# Patient Record
Sex: Male | Born: 1955 | Race: White | Hispanic: No | Smoking: Never smoker
Health system: Southern US, Community
[De-identification: ages and names within clinical notes are randomized; demographics above are authoritative.]

## PROBLEM LIST (undated history)

## (undated) DIAGNOSIS — E669 Obesity, unspecified: Secondary | ICD-10-CM

## (undated) DIAGNOSIS — I5022 Chronic systolic (congestive) heart failure: Secondary | ICD-10-CM

## (undated) DIAGNOSIS — E119 Type 2 diabetes mellitus without complications: Secondary | ICD-10-CM

## (undated) DIAGNOSIS — N183 Chronic kidney disease, stage 3 unspecified: Secondary | ICD-10-CM

## (undated) HISTORY — PX: APPENDECTOMY: SHX54

## (undated) HISTORY — PX: SPLENECTOMY: SUR1306

---

## 2016-01-07 ENCOUNTER — Emergency Department: Payer: Self-pay

## 2016-01-07 ENCOUNTER — Inpatient Hospital Stay
Admit: 2016-01-07 | Discharge: 2016-01-07 | Disposition: A | Discharge status: Home / Self Care | Payer: Self-pay | Attending: Internal Medicine | Admitting: Internal Medicine

## 2016-01-07 ENCOUNTER — Inpatient Hospital Stay
Admission: EM | Admit: 2016-01-07 | Discharge: 2016-01-11 | DRG: 291 | Disposition: A | Discharge status: Home / Self Care | Payer: Self-pay | Attending: Internal Medicine | Admitting: Internal Medicine

## 2016-01-07 ENCOUNTER — Encounter: Payer: Self-pay | Admitting: *Deleted

## 2016-01-07 DIAGNOSIS — E1122 Type 2 diabetes mellitus with diabetic chronic kidney disease: Secondary | ICD-10-CM | POA: Diagnosis present

## 2016-01-07 DIAGNOSIS — E669 Obesity, unspecified: Secondary | ICD-10-CM | POA: Diagnosis present

## 2016-01-07 DIAGNOSIS — E1165 Type 2 diabetes mellitus with hyperglycemia: Secondary | ICD-10-CM | POA: Diagnosis present

## 2016-01-07 DIAGNOSIS — R739 Hyperglycemia, unspecified: Secondary | ICD-10-CM | POA: Diagnosis present

## 2016-01-07 DIAGNOSIS — Z9081 Acquired absence of spleen: Secondary | ICD-10-CM

## 2016-01-07 DIAGNOSIS — I509 Heart failure, unspecified: Secondary | ICD-10-CM | POA: Insufficient documentation

## 2016-01-07 DIAGNOSIS — I248 Other forms of acute ischemic heart disease: Secondary | ICD-10-CM | POA: Diagnosis present

## 2016-01-07 DIAGNOSIS — I5041 Acute combined systolic (congestive) and diastolic (congestive) heart failure: Secondary | ICD-10-CM

## 2016-01-07 DIAGNOSIS — R748 Abnormal levels of other serum enzymes: Secondary | ICD-10-CM | POA: Diagnosis present

## 2016-01-07 DIAGNOSIS — N183 Chronic kidney disease, stage 3 unspecified: Secondary | ICD-10-CM

## 2016-01-07 DIAGNOSIS — R7989 Other specified abnormal findings of blood chemistry: Secondary | ICD-10-CM

## 2016-01-07 DIAGNOSIS — R778 Other specified abnormalities of plasma proteins: Secondary | ICD-10-CM

## 2016-01-07 DIAGNOSIS — Z599 Problem related to housing and economic circumstances, unspecified: Secondary | ICD-10-CM

## 2016-01-07 DIAGNOSIS — R9431 Abnormal electrocardiogram [ECG] [EKG]: Secondary | ICD-10-CM | POA: Diagnosis present

## 2016-01-07 DIAGNOSIS — R0600 Dyspnea, unspecified: Secondary | ICD-10-CM

## 2016-01-07 DIAGNOSIS — R600 Localized edema: Secondary | ICD-10-CM

## 2016-01-07 DIAGNOSIS — E119 Type 2 diabetes mellitus without complications: Secondary | ICD-10-CM

## 2016-01-07 DIAGNOSIS — I5043 Acute on chronic combined systolic (congestive) and diastolic (congestive) heart failure: Secondary | ICD-10-CM | POA: Diagnosis present

## 2016-01-07 DIAGNOSIS — R0902 Hypoxemia: Secondary | ICD-10-CM | POA: Diagnosis present

## 2016-01-07 DIAGNOSIS — I252 Old myocardial infarction: Secondary | ICD-10-CM

## 2016-01-07 DIAGNOSIS — Z9119 Patient's noncompliance with other medical treatment and regimen: Secondary | ICD-10-CM

## 2016-01-07 DIAGNOSIS — Z6829 Body mass index (BMI) 29.0-29.9, adult: Secondary | ICD-10-CM

## 2016-01-07 DIAGNOSIS — I130 Hypertensive heart and chronic kidney disease with heart failure and stage 1 through stage 4 chronic kidney disease, or unspecified chronic kidney disease: Principal | ICD-10-CM | POA: Diagnosis present

## 2016-01-07 DIAGNOSIS — I420 Dilated cardiomyopathy: Secondary | ICD-10-CM

## 2016-01-07 DIAGNOSIS — N289 Disorder of kidney and ureter, unspecified: Secondary | ICD-10-CM | POA: Diagnosis present

## 2016-01-07 DIAGNOSIS — R000 Tachycardia, unspecified: Secondary | ICD-10-CM | POA: Diagnosis present

## 2016-01-07 DIAGNOSIS — N179 Acute kidney failure, unspecified: Secondary | ICD-10-CM | POA: Diagnosis not present

## 2016-01-07 DIAGNOSIS — R630 Anorexia: Secondary | ICD-10-CM | POA: Diagnosis present

## 2016-01-07 DIAGNOSIS — R188 Other ascites: Secondary | ICD-10-CM | POA: Diagnosis present

## 2016-01-07 DIAGNOSIS — Z23 Encounter for immunization: Secondary | ICD-10-CM

## 2016-01-07 HISTORY — DX: Chronic kidney disease, stage 3 unspecified: N18.30

## 2016-01-07 HISTORY — DX: Chronic kidney disease, stage 3 (moderate): N18.3

## 2016-01-07 HISTORY — DX: Chronic systolic (congestive) heart failure: I50.22

## 2016-01-07 HISTORY — DX: Type 2 diabetes mellitus without complications: E11.9

## 2016-01-07 HISTORY — DX: Obesity, unspecified: E66.9

## 2016-01-07 LAB — LIPID PANEL
CHOLESTEROL: 144 mg/dL (ref 0–200)
HDL: 30 mg/dL — ABNORMAL LOW (ref >40)
LDL Cholesterol: 96 mg/dL (ref 0–99)
Total CHOL/HDL Ratio: 4.8 RATIO
Triglycerides: 88 mg/dL (ref <150)
VLDL: 18 mg/dL (ref 0–40)

## 2016-01-07 LAB — HEPATIC FUNCTION PANEL
ALT: 26 U/L (ref 17–63)
AST: 46 U/L — ABNORMAL HIGH (ref 15–41)
Albumin: 3.8 g/dL (ref 3.5–5.0)
Alkaline Phosphatase: 85 U/L (ref 38–126)
Bilirubin, Direct: 0.2 mg/dL (ref 0.1–0.5)
Indirect Bilirubin: 0.9 mg/dL (ref 0.3–0.9)
Total Bilirubin: 1.1 mg/dL (ref 0.3–1.2)
Total Protein: 6.7 g/dL (ref 6.5–8.1)

## 2016-01-07 LAB — BASIC METABOLIC PANEL
Anion gap: 6 (ref 5–15)
BUN: 30 mg/dL — AB (ref 6–20)
CO2: 28 mmol/L (ref 22–32)
Calcium: 9.1 mg/dL (ref 8.9–10.3)
Chloride: 105 mmol/L (ref 101–111)
Creatinine, Ser: 2.15 mg/dL — ABNORMAL HIGH (ref 0.61–1.24)
GFR calc Af Amer: 37 mL/min — ABNORMAL LOW (ref >60)
GFR, EST NON AFRICAN AMERICAN: 32 mL/min — AB (ref >60)
GLUCOSE: 163 mg/dL — AB (ref 65–99)
POTASSIUM: 4.8 mmol/L (ref 3.5–5.1)
Sodium: 139 mmol/L (ref 135–145)

## 2016-01-07 LAB — CBC
HCT: 46.9 % (ref 40.0–52.0)
Hemoglobin: 15.3 g/dL (ref 13.0–18.0)
MCH: 32.3 pg (ref 26.0–34.0)
MCHC: 32.7 g/dL (ref 32.0–36.0)
MCV: 98.7 fL (ref 80.0–100.0)
Platelets: 229 10*3/uL (ref 150–440)
RBC: 4.75 MIL/uL (ref 4.40–5.90)
RDW: 14.6 % — ABNORMAL HIGH (ref 11.5–14.5)
WBC: 9.8 10*3/uL (ref 3.8–10.6)

## 2016-01-07 LAB — BRAIN NATRIURETIC PEPTIDE: B NATRIURETIC PEPTIDE 5: 1655 pg/mL — AB (ref 0.0–100.0)

## 2016-01-07 LAB — TROPONIN I
Troponin I: 0.11 ng/mL — ABNORMAL HIGH (ref <0.031)
Troponin I: 0.12 ng/mL — ABNORMAL HIGH (ref <0.031)
Troponin I: 0.12 ng/mL — ABNORMAL HIGH (ref <0.031)

## 2016-01-07 MED ORDER — HEPARIN SODIUM (PORCINE) 5000 UNIT/ML IJ SOLN
5000.0000 [IU] | Freq: Three times a day (TID) | INTRAMUSCULAR | Status: DC
  Administered 2016-01-07 – 2016-01-08 (×2): 5000 [IU] via SUBCUTANEOUS
  Filled 2016-01-07 (×2): qty 1

## 2016-01-07 MED ORDER — FUROSEMIDE 10 MG/ML IJ SOLN
60.0000 mg | Freq: Once | INTRAMUSCULAR | Status: AC
Start: 2016-01-07 — End: 2016-01-07
  Administered 2016-01-07: 60 mg via INTRAVENOUS
  Filled 2016-01-07: qty 8

## 2016-01-07 MED ORDER — METOPROLOL TARTRATE 25 MG PO TABS
25.0000 mg | ORAL_TABLET | Freq: Two times a day (BID) | ORAL | Status: DC
  Administered 2016-01-07 – 2016-01-08 (×2): 25 mg via ORAL
  Filled 2016-01-07 (×2): qty 1

## 2016-01-07 MED ORDER — SODIUM CHLORIDE 0.9% FLUSH
3.0000 mL | Freq: Two times a day (BID) | INTRAVENOUS | Status: DC
  Administered 2016-01-07 – 2016-01-10 (×6): 3 mL via INTRAVENOUS

## 2016-01-07 MED ORDER — PNEUMOCOCCAL VAC POLYVALENT 25 MCG/0.5ML IJ INJ
0.5000 mL | INJECTION | INTRAMUSCULAR | Status: AC
  Administered 2016-01-08: 0.5 mL via INTRAMUSCULAR
  Filled 2016-01-07 (×2): qty 0.5

## 2016-01-07 MED ORDER — FUROSEMIDE 10 MG/ML IJ SOLN
20.0000 mg | Freq: Two times a day (BID) | INTRAMUSCULAR | Status: DC
  Administered 2016-01-07 – 2016-01-08 (×2): 20 mg via INTRAVENOUS
  Filled 2016-01-07: qty 2
  Filled 2016-01-07: qty 4

## 2016-01-07 MED ORDER — INFLUENZA VAC SPLIT QUAD 0.5 ML IM SUSY
0.5000 mL | PREFILLED_SYRINGE | INTRAMUSCULAR | Status: AC
  Administered 2016-01-08: 0.5 mL via INTRAMUSCULAR
  Filled 2016-01-07: qty 0.5

## 2016-01-07 MED ORDER — ASPIRIN 81 MG PO CHEW
324.0000 mg | CHEWABLE_TABLET | Freq: Once | ORAL | Status: AC
  Administered 2016-01-07: 324 mg via ORAL
  Filled 2016-01-07: qty 4

## 2016-01-07 NOTE — Progress Notes (Signed)
*  PRELIMINARY RESULTS* Echocardiogram 2D Echocardiogram has been performed.  Cristian Goodman 01/07/2016, 9:40 PM

## 2016-01-07 NOTE — ED Notes (Addendum)
Pt >90% on RA

## 2016-01-07 NOTE — ED Notes (Signed)
Pt placed on 2L [place]

## 2016-01-07 NOTE — ED Notes (Signed)
Report given to floor RN. Pt taken to floor via stretcher. Vital signs stable prior to transport.  

## 2016-01-07 NOTE — H&P (Signed)
[facility] - [place] at [facility]   PATIENT NAME: Cristian Goodman    MR#:  [id number]  DATE OF BIRTH:  03-Mar-1956  DATE OF ADMISSION:  01/07/2016  PRIMARY CARE PHYSICIAN: No PCP Per Patient   REQUESTING/REFERRING PHYSICIAN: McShane  CHIEF COMPLAINT:   Chief Complaint  Patient presents with  . Shortness of Breath  . Ascites    HISTORY OF PRESENT ILLNESS: Cristian Goodman  is a 60 y.o. male with a known history of unknown, as he does not go to a doctor. For last 1 or 2 months he started noticing increasing swelling on his legs and all over the body, but denies any shortness of breath or chest pain Consulting with his swelling he also started taking some fluid pills before from his related issues and family members who had heart failure. But it did not help much and so he came to emergency room today. He is noted to have pulmonary edema on chest x-ray and BNP level is high.  PAST MEDICAL HISTORY:  He does not go to her doctor, last time he went for physical checkup was 40 years ago and so he does not know about any past medical history.  PAST SURGICAL HISTORY: Past Surgical History  Procedure Laterality Date  . Appendectomy    . Spleenectomy      SOCIAL HISTORY:  Social History  Substance Use Topics  . Smoking status: Never Smoker   . Smokeless tobacco: Not on file  . Alcohol Use: Not on file    FAMILY HISTORY:  Family History  Problem Relation Age of Onset  . Alzheimer's disease Mother   . Diabetes Father   . CAD Father     DRUG ALLERGIES: No Known Allergies  REVIEW OF SYSTEMS:   CONSTITUTIONAL: No fever, fatigue or weakness.  EYES: No blurred or double vision.  EARS, NOSE, AND THROAT: No tinnitus or ear pain.  RESPIRATORY: No cough, shortness of breath, wheezing or hemoptysis.  CARDIOVASCULAR: No chest pain, orthopnea, edema.  GASTROINTESTINAL: No nausea, vomiting, diarrhea or abdominal pain.  GENITOURINARY: No dysuria, hematuria.   ENDOCRINE: No polyuria, nocturia,  HEMATOLOGY: No anemia, easy bruising or bleeding SKIN: No rash or lesion. MUSCULOSKELETAL: No joint pain or arthritis.  Severe edema on both legs.  NEUROLOGIC: No tingling, numbness, weakness.  PSYCHIATRY: No anxiety or depression.   MEDICATIONS AT HOME:  Prior to Admission medications   Not on File      PHYSICAL EXAMINATION:   VITAL SIGNS: Blood pressure 118/89, pulse 116, temperature 97.5 F (36.4 C), temperature source Oral, resp. rate 19, height 5\' 6"  (1.676 m), weight 99.791 kg (220 lb), SpO2 100 %.  GENERAL:  60 y.o.-year-old patient lying in the bed with no acute distress.  EYES: Pupils equal, round, reactive to light and accommodation. No scleral icterus. Extraocular muscles intact.  HEENT: Head atraumatic, normocephalic. Oropharynx and nasopharynx clear.  NECK:  Supple, no jugular venous distention. No thyroid enlargement, no tenderness.  LUNGS: Normal breath sounds bilaterally, no wheezing, some crepitation. No use of accessory muscles of respiration. Requiring nasal cannula oxygen. CARDIOVASCULAR: S1, S2 normal. No murmurs, rubs, or gallops.  ABDOMEN: Soft, nontender, nondistended. Bowel sounds present. No organomegaly or mass.  EXTREMITIES: Severe pedal edema, no cyanosis, or clubbing.  NEUROLOGIC: Cranial nerves II through XII are intact. Muscle strength 5/5 in all extremities. Sensation intact. Gait not checked.  PSYCHIATRIC: The patient is alert and oriented x 3.  SKIN: No obvious rash, lesion, or ulcer.  LABORATORY PANEL:   CBC  Recent Labs Lab 01/07/16 1355  WBC 9.8  HGB 15.3  HCT 46.9  PLT 229  MCV 98.7  MCH 32.3  MCHC 32.7  RDW 14.6*   ------------------------------------------------------------------------------------------------------------------  Chemistries   Recent Labs Lab 01/07/16 1355  NA 139  K 4.8  CL 105  CO2 28  GLUCOSE 163*  BUN 30*  CREATININE 2.15*  CALCIUM 9.1  AST 46*  ALT 26   ALKPHOS 85  BILITOT 1.1   ------------------------------------------------------------------------------------------------------------------ estimated creatinine clearance is 40.4 mL/min (by C-G formula based on Cr of 2.15). ------------------------------------------------------------------------------------------------------------------ No results for input(s): TSH, T4TOTAL, T3FREE, THYROIDAB in the last 72 hours.  Invalid input(s): FREET3   Coagulation profile No results for input(s): INR, PROTIME in the last 168 hours. ------------------------------------------------------------------------------------------------------------------- No results for input(s): DDIMER in the last 72 hours. -------------------------------------------------------------------------------------------------------------------  Cardiac Enzymes  Recent Labs Lab 01/07/16 1355  TROPONINI 0.11*   ------------------------------------------------------------------------------------------------------------------ Invalid input(s): POCBNP  ---------------------------------------------------------------------------------------------------------------  Urinalysis No results found for: COLORURINE, APPEARANCEUR, LABSPEC, PHURINE, GLUCOSEU, HGBUR, BILIRUBINUR, KETONESUR, PROTEINUR, UROBILINOGEN, NITRITE, LEUKOCYTESUR   RADIOLOGY: Dg Chest 2 View  01/07/2016  CLINICAL DATA:  Two months of shortness of breath especially with exertion, abdominal distention, and lower extremity edema. EXAM: CHEST  2 VIEW COMPARISON:  None in PACs FINDINGS: The lungs are well-expanded. The interstitial markings are increased bilaterally. There small bilateral pleural effusions blunting the costophrenic angles. The cardiac silhouette is enlarged. The pulmonary vascularity is indistinct. The trachea is midline. The bony thorax exhibits no acute abnormality. IMPRESSION: CHF with mild pulmonary interstitial edema and small bilateral pleural  effusions. There is no evidence of pneumonia. Electronically Signed   By: David  Swaz[place] M.D.   On: 01/07/2016 14:26    EKG:  Sinus tachycardia  IMPRESSION AND PLAN:  * Acute CHF  Unknown systolic versus diastolic, get echocardiogram.  Monitor on telemetry, monitor serial troponin.  IV Lasix, intake and output measurement, daily weight, fluid restriction.  * Sinus tachycardia  Most likely secondary to acute CHF and hypoxia, blood pressure is acceptable, we'll start on oral metoprolol.  * Non-compliance to medical follow-ups- case manager to assist with financial issues.  *   Hyperglycemia  Check hemoglobin A1c.  * Renal insufficiency   I'm not sure if this creatinine 2.15 is acute or chronic for him, as he has no follow-ups and lab works in last many years.   With IV Lasix continue monitoring renal function.   All the records are reviewed and case discussed with ED provider. Management plans discussed with the patient, family and they are in agreement.  CODE STATUS: Full code Code Status History    This patient does not have a recorded code status. Please follow your organizational policy for patients in this situation.       TOTAL TIME TAKING CARE OF THIS PATIENT: 50 minutes.  Plan discussed with patient's sister who was present in the room.  Altamese Dilling M.D on 01/07/2016   Between 7am to 6pm - Pager - [phone]  After 6pm go to [email or web address] - password [email or web address] [facility]  [facility]  Office  [phone]  CC: Primary care physician; No PCP Per Patient   Note: This dictation was prepared with Dragon dictation along with smaller phrase technology. Any transcriptional errors that result from this process are unintentional.

## 2016-01-07 NOTE — ED Notes (Addendum)
States shortness of breathe for the past 2 months, states espeically when he is up and walking, abd distention noticed upon assessment, pt states he has been distended for 2 months, pt face pale in color, pt awake and alert, denies any cardiac or medical problems, edema noted to lower ext

## 2016-01-07 NOTE — ED Notes (Signed)
Assisted pt with using the bathroom and hooked him back up to the monitor.

## 2016-01-07 NOTE — ED Provider Notes (Signed)
[facility] Emergency Department Provider Note  ____________________________________________   I have reviewed the triage vital signs and the nursing notes.   HISTORY  Chief Complaint Shortness of Breath and Ascites    HPI Cristian Goodman is a 60 y.o. male denies having any significant past medical history, did have a planned appendectomy and splenectomy years ago he states. Patient has not seen a doctor he states in 40 years. Over the last 2-3 months he has had gradually increasing shortness of breath when he walks around and lower extremity edema, he feels his abdomen is now becoming distended with fluid. He denies any fever or chills. He does not have a cough. He does not have any chest pain he has no chest pain at this time. He has never had an echocardiogram. Walking makes him more exertionally dyspneic, resting seems to help. He is not markedly Presently. Patient denies any abdominal pain nausea or vomiting. Resting makes the symptoms worse.  History reviewed. No pertinent past medical history.  There are no active problems to display for this patient.   Past Surgical History  Procedure Laterality Date  . Appendectomy    . Spleenectomy      No current outpatient prescriptions on file.  Allergies Review of patient's allergies indicates no known allergies.  History reviewed. No pertinent family history.  Social History Social History  Substance Use Topics  . Smoking status: Never Smoker   . Smokeless tobacco: None  . Alcohol Use: None    Review of Systems Constitutional: No fever/chills Eyes: No visual changes. ENT: No sore throat. No stiff neck no neck pain Cardiovascular: Denies chest pain. Respiratory: Positive shortness of breath. Gastrointestinal:   no vomiting.  No diarrhea.  No constipation. Genitourinary: Negative for dysuria. Musculoskeletal: Positive lower extremity swelling Skin: Negative for rash. Neurological: Negative for  headaches, focal weakness or numbness. 10-point ROS otherwise negative.  ____________________________________________   PHYSICAL EXAM:  VITAL SIGNS: ED Triage Vitals  Enc Vitals Group     BP 01/07/16 1353 136/109 mmHg     Pulse Rate 01/07/16 1353 120     Resp 01/07/16 1353 18     Temp 01/07/16 1353 97.5 F (36.4 C)     Temp Source 01/07/16 1353 Oral     SpO2 01/07/16 1353 100 %     Weight 01/07/16 1353 220 lb (99.791 kg)     Height 01/07/16 1353  (1.676 m)     Head Cir --      Peak Flow --      Pain Score --      Pain Loc --      Pain Edu? --      Excl. in GC? --     Constitutional: Alert and oriented. Well appearing and in no acute distress. Eyes: Conjunctivae are normal. PERRL. EOMI. Head: Atraumatic. Nose: No congestion/rhinnorhea. Mouth/Throat: Mucous membranes are moist.  Oropharynx non-erythematous. Neck: No stridor.   Nontender with no meningismus Cardiovascular: Mild tachycardia noted after the patient sits up in bed regular rhythm. Grossly normal heart sounds.  Good peripheral circulation. Respiratory: Patient has tachypnea, he is very diminished in the baselines with occasional rails. No rhonchi no wheeze. Abdominal: Soft and nontender. No distention. No guarding no rebound Back:  There is no focal tenderness or step off there is no midline tenderness there are no lesions noted. there is no CVA tenderness Musculoskeletal: No lower extremity tenderness. No joint effusions, no DVT signs strong distal pulses 3+ bilateral  symmetric pitting edema Neurologic:  Normal speech and language. No gross focal neurologic deficits are appreciated.  Skin:  Skin is warm, dry and intact. No rash noted. Psychiatric: Mood and affect are somewhat anxious. Speech and behavior are normal.  ____________________________________________   LABS (all labs ordered are listed, but only abnormal results are displayed)  Labs Reviewed  BASIC METABOLIC PANEL - Abnormal; Notable for the  following:    Glucose, Bld 163 (*)    BUN 30 (*)    Creatinine, Ser 2.15 (*)    GFR calc non Af Amer 32 (*)    GFR calc Af Amer 37 (*)    All other components within normal limits  CBC - Abnormal; Notable for the following:    RDW 14.6 (*)    All other components within normal limits  TROPONIN I - Abnormal; Notable for the following:    Troponin I 0.11 (*)    All other components within normal limits  BRAIN NATRIURETIC PEPTIDE - Abnormal; Notable for the following:    B Natriuretic Peptide 1655.0 (*)    All other components within normal limits   ____________________________________________  EKG  I personally interpreted any EKGs ordered by me or triage Sinus tachycardia rate 119 no acute ST elevation or acute ST depression normal axis specific ST changes. ____________________________________________  RADIOLOGY  I reviewed any imaging ordered by me or triage that were performed during my shift ____________________________________________   PROCEDURES  Procedure(s) performed: None  Critical Care performed: None  ____________________________________________   INITIAL IMPRESSION / ASSESSMENT AND PLAN / ED COURSE  Pertinent labs & imaging results that were available during my care of the patient were reviewed by me and considered in my medical decision making (see chart for details).  Patient with CHF which has been getting gradually worse for the last 2 months with clear bilateral pitting edema, shortness of breath even moving around the bed. BNP is 1655, troponin 0.11. Does not have any evidence of PE, I do not think that is a likely etiology for this obvious CHF picture. Chest x-ray is consistent with fluid. We will give him IV Lasix, aspirin although he is not having chest pain and his troponin is borderline elevated, patient will require inpatient hospital stay for diuresis the very least. Patient is amenable to this plan. If he has chest pain he will let us know.  ____________________________________________   FINAL CLINICAL IMPRESSION(S) / ED DIAGNOSES  Final diagnoses:  None      This chart was dictated using voice recognition software.  Despite best efforts to proofread,  errors can occur which can change meaning.     Jeanmarie PlantJames A Abubakr Wieman, MD 01/07/16 1447

## 2016-01-07 NOTE — ED Notes (Signed)
Dr. McShane at bedside.  

## 2016-01-08 ENCOUNTER — Encounter: Payer: Self-pay | Admitting: Nurse Practitioner

## 2016-01-08 ENCOUNTER — Inpatient Hospital Stay: Payer: Self-pay

## 2016-01-08 DIAGNOSIS — R9431 Abnormal electrocardiogram [ECG] [EKG]: Secondary | ICD-10-CM

## 2016-01-08 DIAGNOSIS — R778 Other specified abnormalities of plasma proteins: Secondary | ICD-10-CM

## 2016-01-08 DIAGNOSIS — R600 Localized edema: Secondary | ICD-10-CM

## 2016-01-08 DIAGNOSIS — R0600 Dyspnea, unspecified: Secondary | ICD-10-CM

## 2016-01-08 DIAGNOSIS — I5041 Acute combined systolic (congestive) and diastolic (congestive) heart failure: Secondary | ICD-10-CM

## 2016-01-08 DIAGNOSIS — R7989 Other specified abnormal findings of blood chemistry: Secondary | ICD-10-CM

## 2016-01-08 LAB — BASIC METABOLIC PANEL
ANION GAP: 7 (ref 5–15)
BUN: 28 mg/dL — ABNORMAL HIGH (ref 6–20)
CALCIUM: 9.1 mg/dL (ref 8.9–10.3)
CO2: 31 mmol/L (ref 22–32)
Chloride: 104 mmol/L (ref 101–111)
Creatinine, Ser: 2.02 mg/dL — ABNORMAL HIGH (ref 0.61–1.24)
GFR, EST AFRICAN AMERICAN: 40 mL/min — AB (ref >60)
GFR, EST NON AFRICAN AMERICAN: 34 mL/min — AB (ref >60)
GLUCOSE: 89 mg/dL (ref 65–99)
POTASSIUM: 4.5 mmol/L (ref 3.5–5.1)
SODIUM: 142 mmol/L (ref 135–145)

## 2016-01-08 LAB — HEMOGLOBIN A1C: HEMOGLOBIN A1C: 7.2 % — AB (ref 4.0–6.0)

## 2016-01-08 LAB — CBC
HCT: 43.2 % (ref 40.0–52.0)
HEMOGLOBIN: 14.3 g/dL (ref 13.0–18.0)
MCH: 32.9 pg (ref 26.0–34.0)
MCHC: 33 g/dL (ref 32.0–36.0)
MCV: 99.8 fL (ref 80.0–100.0)
Platelets: 206 10*3/uL (ref 150–440)
RBC: 4.33 MIL/uL — ABNORMAL LOW (ref 4.40–5.90)
RDW: 14.6 % — ABNORMAL HIGH (ref 11.5–14.5)
WBC: 8.2 10*3/uL (ref 3.8–10.6)

## 2016-01-08 LAB — TROPONIN I: TROPONIN I: 0.12 ng/mL — AB (ref <0.031)

## 2016-01-08 MED ORDER — ENOXAPARIN SODIUM 40 MG/0.4ML ~~LOC~~ SOLN
40.0000 mg | SUBCUTANEOUS | Status: DC
  Administered 2016-01-08 – 2016-01-10 (×3): 40 mg via SUBCUTANEOUS
  Filled 2016-01-08 (×3): qty 0.4

## 2016-01-08 MED ORDER — CARVEDILOL 3.125 MG PO TABS
3.1250 mg | ORAL_TABLET | Freq: Two times a day (BID) | ORAL | Status: DC
  Administered 2016-01-08 – 2016-01-11 (×6): 3.125 mg via ORAL
  Filled 2016-01-08 (×6): qty 1

## 2016-01-08 MED ORDER — BUMETANIDE 0.25 MG/ML IJ SOLN
2.0000 mg | Freq: Two times a day (BID) | INTRAMUSCULAR | Status: DC
  Administered 2016-01-08 – 2016-01-09 (×2): 2 mg via INTRAVENOUS
  Filled 2016-01-08 (×4): qty 8

## 2016-01-08 NOTE — Plan of Care (Signed)
Problem: Activity: Goal: Capacity to carry out activities will improve Outcome: Progressing Pt reports breathing better.  Respers even, unlabored when up to bathroom to void.

## 2016-01-08 NOTE — Consult Note (Signed)
Cardiology Consult    Patient ID: Cristian Goodman MRN: [id number], DOB/AGE: 1956-06-26   Admit date: 01/07/2016 Date of Consult: 01/08/2016  Primary Physician: No PCP Per Patient Primary Cardiologist: new - seen by Concha Se, MD  Requesting Provider: Suzanne Boron, MD  Patient Profile    60 year old male with no prior cardiac history who presents with acute systolic congestive heart failure.  Past Medical History   Past Medical History  Diagnosis Date  . Obesity     Past Surgical History  Procedure Laterality Date  . Appendectomy      as a child ~ age 55  . 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 He has not seen a doctor in many years. He lives locally by himself and is unemployed. He helps take care of his 71 year old father. He was in his usual state of health until approximately 3 months ago, when he began to experience progressive increase in abdominal girth followed by lower extremity edema. This progressed to include edema of his thighs and flanks as well as scrotal edema. He even noted weeping of his ankles. About 2-3 weeks ago, he began to experience progressive dyspnea on exertion and significant early satiety and anorexia. He says that at no point did he express orthopnea, PND, or chest pain. Due to progression of symptoms, he presented to the [facility] emergency department on March 6 where he was found to have pulmonary edema on chest x-ray with an elevated BNP. Creatinine was elevated at 2.15. EKG showed sinus tachycardia with poor R-wave progression and question of prior anterolateral infarct. He was admitted for further evaluation and diuresis and an echocardiogram was performed and revealed an EF of 30-35% and moderate tricuspid regurgitation. We have been asked to evaluate. His breathing has improved some with IV  diuresis.  Inpatient Medications    . bumetanide  2 mg Intravenous BID AC  . carvedilol  3.125 mg Oral BID WC  . enoxaparin (LOVENOX) injection  40 mg Subcutaneous Q24H  . sodium chloride flush  3 mL Intravenous Q12H    Family History    Family History  Problem Relation Age of Onset  . Alzheimer's disease Mother     died @ 65.  . Diabetes Father     alive @ 51  . CAD Father     Social History    Social History   Social History  . Marital Status: Single    Spouse Name: N/A  . Number of Children: N/A  . Years of Education: N/A   Occupational History  . Not on file.   Social History Main Topics  . Smoking status: Never Smoker   . Smokeless tobacco: Not on file  . Alcohol Use: No  . Drug Use: No  . Sexual Activity: Not on file   Other Topics Concern  . Not on file   Social History Narrative   Lives in [place] by himself.  Does not routinely exercise.  Unemployed.  Takes care of his 29 y/o father (who lives by himself).     Review of Systems    General:  No chills, fever, night sweats or weight changes.  Cardiovascular:  No chest pain, positive dyspnea on exertion, edema, increasing abdominal girth, no orthopnea, palpitations, paroxysmal nocturnal dyspnea. Dermatological: No rash, lesions/masses Respiratory: No cough,  positive dyspnea Urologic: No hematuria, dysuria Abdominal:   Early satiety with increasing abdominal girth, and anorexia. No nausea, vomiting, diarrhea, bright red blood per rectum, melena, or hematemesis Neurologic:  No visual changes, wkns, changes in mental status. All other systems reviewed and are otherwise negative except as noted above.  Physical Exam    Blood pressure 112/82, pulse 101, temperature 98.2 F (36.8 C), temperature source Oral, resp. rate 18, height 5\' 6"  (1.676 m), weight 181 lb 3.2 oz (82.192 kg), SpO2 92 %.  General: Pleasant, NAD Psych: Normal affect. Neuro: Alert and oriented X 3. Moves all extremities  spontaneously. HEENT: Normal  Neck: Supple without bruits. JVD to his jaw. Lungs:  Resp regular and unlabored,  bibasilar crackles, otherwise clear to auscultation. Heart: RRR no s3, s4, or murmurs. Abdomen:  Firm and distended with pitting edema, nontender,BS + x 4.  Extremities: No clubbing, cyanosise. 2-3+ bilateral lower extremity edema extending to his thighs. DP/PT/Radials 2+ and equal bilaterally.  Labs     Recent Labs  01/07/16 1355 01/07/16 1716 01/07/16 2101 01/08/16 0057  TROPONINI 0.11* 0.12* 0.12* 0.12*   Lab Results  Component Value Date   WBC 8.2 01/08/2016   HGB 14.3 01/08/2016   HCT 43.2 01/08/2016   MCV 99.8 01/08/2016   PLT 206 01/08/2016    Recent Labs Lab 01/07/16 1355 01/08/16 0458  NA 139 142  K 4.8 4.5  CL 105 104  CO2 28 31  BUN 30* 28*  CREATININE 2.15* 2.02*  CALCIUM 9.1 9.1  PROT 6.7  --   BILITOT 1.1  --   ALKPHOS 85  --   ALT 26  --   AST 46*  --   GLUCOSE 163* 89   Lab Results  Component Value Date   CHOL 144 01/07/2016   HDL 30* 01/07/2016   LDLCALC 96 01/07/2016   TRIG 88 01/07/2016     Radiology Studies    Dg Chest 2 View  01/08/2016  CLINICAL DATA:  60 year old with bilateral lower extremity swelling for 1 month. Shortness of breath. Acute congestive heart failure. EXAM: CHEST  2 VIEW COMPARISON:  01/07/2016 radiographs. FINDINGS: Cardiomegaly, pulmonary edema and bilateral pleural effusions have mildly improved since yesterday's examination. There is associated mild bibasilar atelectasis, although no confluent airspace opacity. The bones appear unchanged. IMPRESSION: Interval improvement in mild congestive heart failure. Electronically Signed   By: Carey BullocksWilliam  Veazey M.D.   On: 01/08/2016 07:50   Dg Chest 2 View  01/07/2016  CLINICAL DATA:  Two months of shortness of breath especially with exertion, abdominal distention, and lower extremity edema. EXAM: CHEST  2 VIEW COMPARISON:  None in PACs FINDINGS: The lungs are  well-expanded. The interstitial markings are increased bilaterally. There small bilateral pleural effusions blunting the costophrenic angles. The cardiac silhouette is enlarged. The pulmonary vascularity is indistinct. The trachea is midline. The bony thorax exhibits no acute abnormality. IMPRESSION: CHF with mild pulmonary interstitial edema and small bilateral pleural effusions. There is no evidence of pneumonia. Electronically Signed   By: David  SwazilandJordan M.D.   On: 01/07/2016 14:26    ECG & Cardiac Imaging    Sinus tach, 119, LAE, poor R progression.  2D Echocardiogram 3.6.2017  Study Conclusions  - Left ventricle: Systolic function was moderately to severely reduced. The estimated ejection fraction was in the range of 30% to 35%. - Aortic valve: There was trivial regurgitation. Valve area (Vmax): 2.83 cm^2. - Right ventricle: The cavity size was mildly dilated. - Right  atrium: The atrium was mildly dilated. - Tricuspid valve: There was moderate regurgitation. - Pericardium, extracardiac: A small pericardial effusion was identified posterior to the heart. Features were not consistent with tamponade physiology.  Assessment & Plan    1. Acute systolic congestive heart failure: Patient presented to [facility] on March 6 with a three-month history of progressive lower extremity edema and increasing abdominal girth followed by anorexia, early satiety, and dyspnea on exertion. He believes he has gained about 40 pounds in the past 3-4 months. Chest x-ray showed CHF while BNP was elevated at 1655. Troponins are mildly elevated and flat. Echocardiogram showing LV dysfunction with EF 30-35%. He has diuresed some on 20 mg of IV Lasix twice a day and is currently down 1.1 L. His weight has come down 2 pounds with some symptomatic improvement though he remains markedly volume overloaded. He has significant gut and flank edema and with renal insufficiency, I will switch  him to intravenous Bumex. Following aggressive diuresis, we can consider diagnostic catheterization. I spoke to the patient at length about risks and benefits of diagnostic catheterization and at this time, he is not sure that he would like to proceed. If he is unwilling to proceed with catheterization or if renal function does not improve, we could consider noninvasive ischemic testing, understanding that at best, it may guide anti-anginal therapy if ischemia is noted. He has already been placed on beta blocker therapy and has received 2 doses. I will switch this from metoprolol to carvedilol. His blood pressure has been variable but soft at times and therefore I'm reluctant to add hydralazine or nitrate yet. He is a poor candidate for ACE inhibitor, ARB, spiro, or Entresto given current renal insufficiency of unknown chronicity.   2. Acute kidney injury: Patient's admission creatinine was 2.15. We do not have any older creatinines to compare this to. I suspect this may be secondary to low output heart failure. Follow closely with diuresis. Continue to avoid ACE inhibitor, ARB, spironolactone, or Entresto.  3. Elevated troponin: Likely secondary to demand ischemia in the setting of #1. ECG is notable for poor R-wave progression with question of old anterolateral MI, which would explain LV dysfunction. As above, he is reluctant to consider catheterization at this time but we will continue to discuss as his volume status improves. The point may be moot if renal function does not improve.   Signed, Nicolasa Ducking, NP 01/08/2016, 1:26 PM

## 2016-01-08 NOTE — Care Management (Signed)
Patient presents from home for shortness of breath of 2 months duration.  he lives with his father and says the reason he has all this swelling is because he has been eating as much salt as his dad has been eating.  He has no income, does drive a car but his dad pays for the insurance and gas.  Patient has not seen a doctor in over 40 years.  He has no insurance.  [place] resident of [place].  Discussed [facility] and [facility] with patient.  Provided him with applications and reached out to his brother Chrissie NoaWilliam to provide assist with completion of the application process for bother because CM has concern that patient can not complete the process on his own.  Have notified [facility] of anticipated assistance need with meds.  Referral to heart failure clinic.  Patient is on fluid restriction.  Patient is not home bound and at present would not meet home bound criteria.  Will assess for need of scales.

## 2016-01-08 NOTE — Progress Notes (Signed)
[facility] [place] [facility]   PATIENT NAME: Cristian Harderimothy Iafrate    MR#:  [id number]  DATE OF BIRTH:  09/13/1956  SUBJECTIVE: Admitted for shortness of breath. Found to have new onset CHF. Patient is on IV Lasix. Today he feels slightly better. Denies any Chest pain. Has been having orthopnea and PND for more than a week.   CHIEF COMPLAINT:   Chief Complaint  Patient presents with  . Shortness of Breath  . Ascites    REVIEW OF SYSTEMS:    Review of Systems  Constitutional: Negative for fever and chills.  HENT: Negative for hearing loss.   Eyes: Negative for blurred vision, double vision and photophobia.  Respiratory: Positive for shortness of breath. Negative for cough and hemoptysis.   Cardiovascular: Positive for orthopnea and leg swelling. Negative for palpitations.  Gastrointestinal: Negative for vomiting, abdominal pain and diarrhea.  Genitourinary: Negative for dysuria and urgency.  Musculoskeletal: Negative for myalgias and neck pain.  Skin: Negative for rash.  Neurological: Negative for dizziness, focal weakness, seizures, weakness and headaches.  Psychiatric/Behavioral: Negative for memory loss. The patient does not have insomnia.     Nutrition:  Tolerating Diet: Tolerating PT:      DRUG ALLERGIES:  No Known Allergies  VITALS:  Blood pressure 123/88, pulse 103, temperature 98.6 F (37 C), temperature source Oral, resp. rate 20, height 5\' 6"  (1.676 m), weight 82.192 kg (181 lb 3.2 oz), SpO2 93 %.  PHYSICAL EXAMINATION:   Physical Exam  GENERAL:  60 y.o.-year-old patient lying in the bed with no acute distress.  EYES: Pupils equal, round, reactive to light and accommodation. No scleral icterus. Extraocular muscles intact.  HEENT: Head atraumatic, normocephalic. Oropharynx and nasopharynx clear.  NECK:  Supple, no jugular venous distention. No thyroid enlargement, no tenderness.  LUNGS: Normal breath sounds bilaterally, no  wheezing, rales,rhonchi or crepitation. No use of accessory muscles of respiration.  CARDIOVASCULAR: S1, S2 normal. No murmurs, rubs, or gallops.  ABDOMEN: Soft, nontender. Bowel sounds present. No organomegaly or mass. Slightly distended with ascites. EXTREMITIES: No pedal edema, cyanosis, or clubbing.  NEUROLOGIC: Cranial nerves II through XII are intact. Muscle strength 5/5 in all extremities. Sensation intact. Gait not checked.  PSYCHIATRIC: The patient is alert and oriented x 3.  SKIN: No obvious rash, lesion, or ulcer.    LABORATORY PANEL:   CBC  Recent Labs Lab 01/08/16 0458  WBC 8.2  HGB 14.3  HCT 43.2  PLT 206   ------------------------------------------------------------------------------------------------------------------  Chemistries   Recent Labs Lab 01/07/16 1355 01/08/16 0458  NA 139 142  K 4.8 4.5  CL 105 104  CO2 28 31  GLUCOSE 163* 89  BUN 30* 28*  CREATININE 2.15* 2.02*  CALCIUM 9.1 9.1  AST 46*  --   ALT 26  --   ALKPHOS 85  --   BILITOT 1.1  --    ------------------------------------------------------------------------------------------------------------------  Cardiac Enzymes  Recent Labs Lab 01/08/16 0057  TROPONINI 0.12*   ------------------------------------------------------------------------------------------------------------------  RADIOLOGY:  Dg Chest 2 View  01/08/2016  CLINICAL DATA:  60 year old with bilateral lower extremity swelling for 1 month. Shortness of breath. Acute congestive heart failure. EXAM: CHEST  2 VIEW COMPARISON:  01/07/2016 radiographs. FINDINGS: Cardiomegaly, pulmonary edema and bilateral pleural effusions have mildly improved since yesterday's examination. There is associated mild bibasilar atelectasis, although no confluent airspace opacity. The bones appear unchanged. IMPRESSION: Interval improvement in mild congestive heart failure. Electronically Signed   By: Hilarie FredricksonWilliam  Veazey M.D.  On: 01/08/2016 07:50    Dg Chest 2 View  01/07/2016  CLINICAL DATA:  Two months of shortness of breath especially with exertion, abdominal distention, and lower extremity edema. EXAM: CHEST  2 VIEW COMPARISON:  None in PACs FINDINGS: The lungs are well-expanded. The interstitial markings are increased bilaterally. There small bilateral pleural effusions blunting the costophrenic angles. The cardiac silhouette is enlarged. The pulmonary vascularity is indistinct. The trachea is midline. The bony thorax exhibits no acute abnormality. IMPRESSION: CHF with mild pulmonary interstitial edema and small bilateral pleural effusions. There is no evidence of pneumonia. Electronically Signed   By: David  [place] M.D.   On: 01/07/2016 14:26     ASSESSMENT AND PLAN:   Principal Problem:   Acute CHF (congestive heart failure) (HCC)   1.acute CHF: No prior diagnosis, no other medical problems.  EF 35% but normal wall motion abnormalities by echo done today. Consult cardiology because of new onset CHF.  Continue IV Lasix today. #2 elevated troponins likely due to CHF: No chest pain. Obtain cardiology consult for new onset CHF, elevated troponins. #3 chronic renal failure: Baseline GFR  40, creatinine up to 2. Patient to has no prior diagnosis, did not see primary doctor for a long time. Monitor BMP closely.  All the records are reviewed and case discussed with Care Management/Social Workerr. Management plans discussed with the patient, family and they are in agreement.  CODE STATUS: full  TOTAL TIME TAKING CARE OF THIS PATIENT: 35 minutes.   POSSIBLE D/C IN 1-2 DAYS, DEPENDING ON CLINICAL CONDITION.   Katha Hamming M.D on 01/08/2016 at 12:17 PM  Between 7am to 6pm - Pager - [phone]  After 6pm go to [email or web address] - password [email or web address] [facility]  [facility]  Office  [phone]  CC: Primary care physician; No PCP Per Patient

## 2016-01-09 LAB — BASIC METABOLIC PANEL
ANION GAP: 6 (ref 5–15)
BUN: 31 mg/dL — ABNORMAL HIGH (ref 6–20)
CHLORIDE: 103 mmol/L (ref 101–111)
CO2: 31 mmol/L (ref 22–32)
Calcium: 8.8 mg/dL — ABNORMAL LOW (ref 8.9–10.3)
Creatinine, Ser: 2.07 mg/dL — ABNORMAL HIGH (ref 0.61–1.24)
GFR calc non Af Amer: 33 mL/min — ABNORMAL LOW (ref >60)
GFR, EST AFRICAN AMERICAN: 38 mL/min — AB (ref >60)
Glucose, Bld: 101 mg/dL — ABNORMAL HIGH (ref 65–99)
Potassium: 4.2 mmol/L (ref 3.5–5.1)
Sodium: 140 mmol/L (ref 135–145)

## 2016-01-09 MED ORDER — TORSEMIDE 20 MG PO TABS: 20.0000 mg | ORAL_TABLET | Freq: Two times a day (BID) | ORAL | Status: DC

## 2016-01-09 MED ORDER — FUROSEMIDE 10 MG/ML IJ SOLN
10.0000 mg/h | INTRAVENOUS | Status: DC
  Administered 2016-01-09 – 2016-01-10 (×2): 10 mg/h via INTRAVENOUS
  Filled 2016-01-09 (×2): qty 25

## 2016-01-09 MED ORDER — CARVEDILOL 3.125 MG PO TABS: 3.1250 mg | ORAL_TABLET | Freq: Two times a day (BID) | ORAL | Status: DC

## 2016-01-09 MED ORDER — BUMETANIDE 2 MG PO TABS: 2.0000 mg | ORAL_TABLET | Freq: Two times a day (BID) | ORAL | Status: DC

## 2016-01-09 NOTE — Progress Notes (Signed)
Patient: Cristian Goodman / Admit Date: 01/07/2016 / Date of Encounter: 01/09/2016, 9:42 AM   Subjective: Significant leg edema extending up to his abdomen Nausea this morning after eating, early satiety Scant cough when supine Reports abdomen still feels tight  Review of Systems: Review of Systems  Constitutional: Negative.   Respiratory: Positive for shortness of breath.   Cardiovascular: Positive for leg swelling.  Gastrointestinal:       Stomach swelling  Musculoskeletal: Negative.   Neurological: Negative.   Psychiatric/Behavioral: Negative.   All other systems reviewed and are negative.   Objective: Telemetry:  Physical Exam: Blood pressure 124/83, pulse 88, temperature 98 F (36.7 C), temperature source Oral, resp. rate 18, height  (1.676 m), weight 176 lb 8 oz (80.06 kg), SpO2 93 %. Body mass index is 28.5 kg/(m^2). General: Pleasant, NAD Psych: Normal affect. Neuro: Alert and oriented X 3. Moves all extremities spontaneously. HEENT: Normal Neck: Supple without bruits. JVD to his jaw. Lungs: Resp regular and unlabored, bibasilar crackles, otherwise clear to auscultation. Heart: RRR no s3, s4, or murmurs. Abdomen: Firm and distended with pitting edema, nontender,BS + x 4.  Extremities: No clubbing, cyanosise. 2-3+ bilateral lower extremity edema extending to his thighs. DP/PT/Radials 2+ and equal bilaterally.   Intake/Output Summary (Last 24 hours) at 01/09/16 0942 Last data filed at 01/09/16 0426  Gross per 24 hour  Intake    600 ml  Output   2150 ml  Net  -1550 ml    Inpatient Medications:  . bumetanide  2 mg Intravenous BID AC  . carvedilol  3.125 mg Oral BID WC  . enoxaparin (LOVENOX) injection  40 mg Subcutaneous Q24H  . sodium chloride flush  3 mL Intravenous Q12H   Infusions:    Labs:  Recent Labs  01/08/16 0458 01/09/16 0413  NA 142 140  K 4.5 4.2  CL 104 103  CO2 31 31  GLUCOSE 89 101*  BUN 28* 31*  CREATININE 2.02*  2.07*  CALCIUM 9.1 8.8*    Recent Labs  01/07/16 1355  AST 46*  ALT 26  ALKPHOS 85  BILITOT 1.1  PROT 6.7  ALBUMIN 3.8    Recent Labs  01/07/16 1355 01/08/16 0458  WBC 9.8 8.2  HGB 15.3 14.3  HCT 46.9 43.2  MCV 98.7 99.8  PLT 229 206    Recent Labs  01/07/16 1355 01/07/16 1716 01/07/16 2101 01/08/16 0057  TROPONINI 0.11* 0.12* 0.12* 0.12*   Invalid input(s): POCBNP  Recent Labs  01/07/16 1400  HGBA1C 7.2*     Weights: Filed Weights   01/07/16 2001 01/08/16 0440 01/09/16 0429  Weight: 183 lb 4.8 oz (83.144 kg) 181 lb 3.2 oz (82.192 kg) 176 lb 8 oz (80.06 kg)     Radiology/Studies:  Dg Chest 2 View  01/08/2016  CLINICAL DATA:  60 year old with bilateral lower extremity swelling for 1 month. Shortness of breath. Acute congestive heart failure. EXAM: CHEST  2 VIEW COMPARISON:  01/07/2016 radiographs. FINDINGS: Cardiomegaly, pulmonary edema and bilateral pleural effusions have mildly improved since yesterday's examination. There is associated mild bibasilar atelectasis, although no confluent airspace opacity. The bones appear unchanged. IMPRESSION: Interval improvement in mild congestive heart failure. Electronically Signed   By: Carey Bullocks M.D.   On: 01/08/2016 07:50   Dg Chest 2 View  01/07/2016  CLINICAL DATA:  Two months of shortness of breath especially with exertion, abdominal distention, and lower extremity edema. EXAM: CHEST  2 VIEW COMPARISON:  None in  PACs FINDINGS: The lungs are well-expanded. The interstitial markings are increased bilaterally. There small bilateral pleural effusions blunting the costophrenic angles. The cardiac silhouette is enlarged. The pulmonary vascularity is indistinct. The trachea is midline. The bony thorax exhibits no acute abnormality. IMPRESSION: CHF with mild pulmonary interstitial edema and small bilateral pleural effusions. There is no evidence of pneumonia. Electronically Signed   By: David  SwazilandJordan M.D.   On: 01/07/2016  14:26     Assessment and Plan  60 y.o. male    1) acute systolic CHF Ejection fraction 30-35%, etiology unclear, possibly He is not particularly interested in cardiac catheterization for ischemia workup As an outpt will help to arrange a noninvasive Myoview if he prefers --Would continue carvedilol --We'll change to Lasix infusion 10 mg/h  2)  renal failure, chronic Concerning for cardiorenal syndrome, unable to exclude chronic renal failure Possibly from chronic long standing diabetes -- avoid ACE inhibitor, ARB for now Needs nephrology consult  3) abnormal EKG Unable to exclude old anterior MI Currently not interested in cardiac catheterization Elevated troponin but stable 0.12  4) Anorexia  possibly secondary to bowel wall edema  symptoms may improve with diuresis  5) cardiomyopathy, Etiology unclear Needs outpt ischemia workup (patient has indicated he would like to go home)  6) Diabetes: Needs outpt follow up with PMD    Total encounter time more than 35 minutes  Greater than 50% was spent in counseling and coordination of care with the patient    Signed, Dossie Arbourim Gollan, MD, Ph.D. [facility] 01/09/2016, 9:42 AM

## 2016-01-09 NOTE — Progress Notes (Signed)
[facility] [place] [facility]   PATIENT NAME: Cristian Goodman    MR#:  [id number]  DATE OF BIRTH:  [birth year]  SUBJECTIVE: Admitted for shortness of breath. Found to have new onset CHF. Patient is on IV Lasix. Denies any Chest pain. Has been having orthopnea and PND for more than a week.   Says she feels better today. Wants to go home. no Shortness of breath or pedal edema at this time.   CHIEF COMPLAINT:   Chief Complaint  Patient presents with  . Shortness of Breath  . Ascites    REVIEW OF SYSTEMS:    Review of Systems  Constitutional: Negative for fever and chills.  HENT: Negative for hearing loss.   Eyes: Negative for blurred vision, double vision and photophobia.  Respiratory: Negative for cough, hemoptysis and shortness of breath.   Cardiovascular: Negative for palpitations, orthopnea, claudication and leg swelling.  Gastrointestinal: Negative for vomiting, abdominal pain and diarrhea.  Genitourinary: Negative for dysuria and urgency.  Musculoskeletal: Negative for myalgias and neck pain.  Skin: Negative for rash.  Neurological: Negative for dizziness, focal weakness, seizures, weakness and headaches.  Psychiatric/Behavioral: Negative for memory loss. The patient does not have insomnia.     Nutrition:  Tolerating Diet: Tolerating PT:      DRUG ALLERGIES:  No Known Allergies  VITALS:  Blood pressure 124/83, pulse 88, temperature 98 F (36.7 C), temperature source Oral, resp. rate 18, height 5\' 6"  (1.676 m), weight 80.06 kg (176 lb 8 oz), SpO2 93 %.  PHYSICAL EXAMINATION:   Physical Exam  GENERAL:  60 y.o.-year-old patient lying in the bed with no acute distress.  EYES: Pupils equal, round, reactive to light and accommodation. No scleral icterus. Extraocular muscles intact.  HEENT: Head atraumatic, normocephalic. Oropharynx and nasopharynx clear.  NECK:  Supple, no jugular venous distention. No thyroid enlargement, no tenderness.   LUNGS: Normal breath sounds bilaterally, no wheezing, rales,rhonchi or crepitation. No use of accessory muscles of respiration.  CARDIOVASCULAR: S1, S2 normal. No murmurs, rubs, or gallops.  ABDOMEN: Soft, nontender. Bowel sounds present. No organomegaly or mass. Slightly distended with ascites. EXTREMITIES: No pedal edema, cyanosis, or clubbing.  NEUROLOGIC: Cranial nerves II through XII are intact. Muscle strength 5/5 in all extremities. Sensation intact. Gait not checked.  PSYCHIATRIC: The patient is alert and oriented x 3.  SKIN: No obvious rash, lesion, or ulcer.    LABORATORY PANEL:   CBC  Recent Labs Lab 01/08/16 0458  WBC 8.2  HGB 14.3  HCT 43.2  PLT 206   ------------------------------------------------------------------------------------------------------------------  Chemistries   Recent Labs Lab 01/07/16 1355  01/09/16 0413  NA 139  < > 140  K 4.8  < > 4.2  CL 105  < > 103  CO2 28  < > 31  GLUCOSE 163*  < > 101*  BUN 30*  < > 31*  CREATININE 2.15*  < > 2.07*  CALCIUM 9.1  < > 8.8*  AST 46*  --   --   ALT 26  --   --   ALKPHOS 85  --   --   BILITOT 1.1  --   --   < > = values in this interval not displayed. ------------------------------------------------------------------------------------------------------------------  Cardiac Enzymes  Recent Labs Lab 01/08/16 0057  TROPONINI 0.12*   ------------------------------------------------------------------------------------------------------------------  RADIOLOGY:  Dg Chest 2 View  01/08/2016  CLINICAL DATA:  60 year old with bilateral lower extremity swelling for 1 month. Shortness of breath. Acute congestive heart  failure. EXAM: CHEST  2 VIEW COMPARISON:  01/07/2016 radiographs. FINDINGS: Cardiomegaly, pulmonary edema and bilateral pleural effusions have mildly improved since yesterday's examination. There is associated mild bibasilar atelectasis, although no confluent airspace opacity. The bones  appear unchanged. IMPRESSION: Interval improvement in mild congestive heart failure. Electronically Signed   By: Carey Bullocks M.D.   On: 01/08/2016 07:50   Dg Chest 2 View  01/07/2016  CLINICAL DATA:  Two months of shortness of breath especially with exertion, abdominal distention, and lower extremity edema. EXAM: CHEST  2 VIEW COMPARISON:  None in PACs FINDINGS: The lungs are well-expanded. The interstitial markings are increased bilaterally. There small bilateral pleural effusions blunting the costophrenic angles. The cardiac silhouette is enlarged. The pulmonary vascularity is indistinct. The trachea is midline. The bony thorax exhibits no acute abnormality. IMPRESSION: CHF with mild pulmonary interstitial edema and small bilateral pleural effusions. There is no evidence of pneumonia. Electronically Signed   By: David  [place] M.D.   On: 01/07/2016 14:26     ASSESSMENT AND PLAN:   Principal Problem:   Acute CHF (congestive heart failure) (HCC) Active Problems:   Dyspnea   Elevated troponin   Systolic and diastolic CHF, acute (HCC)   Bilateral leg edema   Abnormal EKG   1 acute systolic heart failure: Seen by cardiology, discussed options of cardiac catheter versus noninvasive Myoview stress test. Patient at least should have a stress test before discharge. EF 35% by echo. Continue Bumex, Coreg. Advised about low salt diet, fluid restriction. He admits that he eats lots of salt given education about CHF and has follow-up appointment. At CHF clinic.  #2 elevated troponins likely due to CHF: No chest pain . #3 chronic renal failure: Baseline GFR  40, creatinine up to 2. Patient to has no prior diagnosis, did not see primary doctor for a long time. Patient creatinine is still 2. Monitor closely. Likely  discharge today if Patient does not want any further cardiac workup. All the records are reviewed and case discussed with Care Management/Social Workerr. Management plans discussed with the  patient, family and they are in agreement.  CODE STATUS: full  TOTAL TIME TAKING CARE OF THIS PATIENT: 35 minutes.   POSSIBLE D/C IN 1-2 DAYS, DEPENDING ON CLINICAL CONDITION.   Cristian Goodman M.D on 01/09/2016 at 9:08 AM  Between 7am to 6pm - Pager - [phone]  After 6pm go to [email or web address] - password [email or web address] [facility]  [facility]  Office  [phone]  CC: Primary care physician; No PCP Per Patient

## 2016-01-09 NOTE — Discharge Instructions (Signed)
Heart Failure Clinic appointment on February 01, 2016 at 9:00am with Cristian Kindredina Jazyiah Yiu, FNP. Please call [phone] to reschedule.

## 2016-01-09 NOTE — Progress Notes (Signed)
Initial appointment made at the Heart Failure Clinic on February 01, 2016 at 9:00am. Thank you for the referral.

## 2016-01-09 NOTE — Progress Notes (Signed)
Patient has no acute event overnight. He remained in NSR with stable VS. Patient bilateral leg were elevated on a pillow.Patient has a decrease bilateral LE edema. Will continue to monitor.

## 2016-01-10 DIAGNOSIS — N183 Chronic kidney disease, stage 3 unspecified: Secondary | ICD-10-CM

## 2016-01-10 DIAGNOSIS — I420 Dilated cardiomyopathy: Secondary | ICD-10-CM

## 2016-01-10 LAB — BASIC METABOLIC PANEL
ANION GAP: 9 (ref 5–15)
BUN: 32 mg/dL — ABNORMAL HIGH (ref 6–20)
CALCIUM: 9.3 mg/dL (ref 8.9–10.3)
CHLORIDE: 96 mmol/L — AB (ref 101–111)
CO2: 36 mmol/L — AB (ref 22–32)
Creatinine, Ser: 2.07 mg/dL — ABNORMAL HIGH (ref 0.61–1.24)
GFR calc Af Amer: 38 mL/min — ABNORMAL LOW (ref >60)
GFR calc non Af Amer: 33 mL/min — ABNORMAL LOW (ref >60)
GLUCOSE: 131 mg/dL — AB (ref 65–99)
POTASSIUM: 3.9 mmol/L (ref 3.5–5.1)
Sodium: 141 mmol/L (ref 135–145)

## 2016-01-10 MED ORDER — ISOSORBIDE DINITRATE 10 MG PO TABS
5.0000 mg | ORAL_TABLET | Freq: Two times a day (BID) | ORAL | Status: DC
  Administered 2016-01-10 – 2016-01-11 (×3): 5 mg via ORAL
  Filled 2016-01-10: qty 1
  Filled 2016-01-10: qty 2
  Filled 2016-01-10: qty 1

## 2016-01-10 MED ORDER — HYDRALAZINE HCL 10 MG PO TABS
10.0000 mg | ORAL_TABLET | Freq: Three times a day (TID) | ORAL | Status: DC
  Administered 2016-01-10 (×2): 10 mg via ORAL
  Filled 2016-01-10 (×3): qty 1

## 2016-01-10 MED ORDER — LIVING WELL WITH DIABETES BOOK
Freq: Once | Status: AC
  Administered 2016-01-10: 18:00:00
  Filled 2016-01-10: qty 1

## 2016-01-10 NOTE — Progress Notes (Signed)
Patient Name: Cristian Goodman Date of Encounter: 01/10/2016  Hospital Problem List     Principal Problem:   Systolic and diastolic CHF, acute [facility](HCC) Active Problems:   Congestive dilated cardiomyopathy (HCC)   Dyspnea   Bilateral leg edema   CKD (chronic kidney disease), stage III   Elevated troponin    Subjective   Breathing improving.  Didn't sleep much - on lasix gtt, using bathroom freq.  Not interested in condom cath.  Inpatient Medications    . carvedilol  3.125 mg Oral BID WC  . enoxaparin (LOVENOX) injection  40 mg Subcutaneous Q24H  . sodium chloride flush  3 mL Intravenous Q12H  lasix gtt @ 10mg /hr.  Vital Signs    Filed Vitals:   01/09/16 0735 01/09/16 1135 01/09/16 2018 01/10/16 0433  BP: 124/83 116/84 115/76 125/85  Pulse: 88 98 99 96  Temp:  97.9 F (36.6 C) 98.3 F (36.8 C) 98 F (36.7 C)  TempSrc:  Oral Oral Oral  Resp: 18 16 18 18   Height:      Weight:    167 lb 1.6 oz (75.796 kg)  SpO2: 93% 94% 95% 94%    Intake/Output Summary (Last 24 hours) at 01/10/16 0947 Last data filed at 01/10/16 0800  Gross per 24 hour  Intake 207.67 ml  Output   5425 ml  Net -5217.33 ml   Filed Weights   01/08/16 0440 01/09/16 0429 01/10/16 0433  Weight: 181 lb 3.2 oz (82.192 kg) 176 lb 8 oz (80.06 kg) 167 lb 1.6 oz (75.796 kg)    Physical Exam    General: Pleasant, NAD. Neuro: Alert and oriented X 3. Moves all extremities spontaneously. Psych: Normal affect. HEENT:  Normal  Neck: Supple without bruits.  JVP ~ 12 cm. Lungs:  Resp regular and unlabored, CTA. Heart: RRR no s3, s4, or murmurs. Abdomen: Much softer, non-tender, non-distended, BS + x 4.  Extremities: No clubbing, cyanosis.  2+ bilat LE edema. DP/PT/Radials 2+ and equal bilaterally.  Labs    CBC  Recent Labs  01/07/16 1355 01/08/16 0458  WBC 9.8 8.2  HGB 15.3 14.3  HCT 46.9 43.2  MCV 98.7 99.8  PLT 229 206   Basic Metabolic Panel  Recent Labs  01/08/16 0458 01/09/16 0413  NA  142 140  K 4.5 4.2  CL 104 103  CO2 31 31  GLUCOSE 89 101*  BUN 28* 31*  CREATININE 2.02* 2.07*  CALCIUM 9.1 8.8*   Liver Function Tests  Recent Labs  01/07/16 1355  AST 46*  ALT 26  ALKPHOS 85  BILITOT 1.1  PROT 6.7  ALBUMIN 3.8   Cardiac Enzymes  Recent Labs  01/07/16 1716 01/07/16 2101 01/08/16 0057  TROPONINI 0.12* 0.12* 0.12*   Hemoglobin A1C  Recent Labs  01/07/16 1400  HGBA1C 7.2*   Fasting Lipid Panel  Recent Labs  01/07/16 1716  CHOL 144  HDL 30*  LDLCALC 96  TRIG 88  CHOLHDL 4.8    Telemetry    SR/ST, PVC's.  Radiology    Dg Chest 2 View  01/08/2016  CLINICAL DATA:  60 year old with bilateral lower extremity swelling for 1 month. Shortness of breath. Acute congestive heart failure. EXAM: CHEST  2 VIEW COMPARISON:  01/07/2016 radiographs. FINDINGS: Cardiomegaly, pulmonary edema and bilateral pleural effusions have mildly improved since yesterday's examination. There is associated mild bibasilar atelectasis, although no confluent airspace opacity. The bones appear unchanged. IMPRESSION: Interval improvement in mild congestive heart failure. Electronically Signed  By: Carey Bullocks M.D.   On: 01/08/2016 07:50   Dg Chest 2 View  01/07/2016  CLINICAL DATA:  Two months of shortness of breath especially with exertion, abdominal distention, and lower extremity edema. EXAM: CHEST  2 VIEW COMPARISON:  None in PACs FINDINGS: The lungs are well-expanded. The interstitial markings are increased bilaterally. There small bilateral pleural effusions blunting the costophrenic angles. The cardiac silhouette is enlarged. The pulmonary vascularity is indistinct. The trachea is midline. The bony thorax exhibits no acute abnormality. IMPRESSION: CHF with mild pulmonary interstitial edema and small bilateral pleural effusions. There is no evidence of pneumonia. Electronically Signed   By: David  [place] M.D.   On: 01/07/2016 14:26   2D Echocardiogram 3.6.2017  Study  Conclusions  - Left ventricle: Systolic function was moderately to severely   reduced. The estimated ejection fraction was in the range of 30%   to 35%. - Aortic valve: There was trivial regurgitation. Valve area (Vmax):   2.83 cm^2. - Right ventricle: The cavity size was mildly dilated. - Right atrium: The atrium was mildly dilated. - Tricuspid valve: There was moderate regurgitation. - Pericardium, extracardiac: A small pericardial effusion was   identified posterior to the heart. Features were not consistent   with tamponade physiology.  Assessment & Plan    1.  Acute systolic CHF/Cardiomyopathy (? ICM vs NICM):   Admitted 3/6 with a three month h/o progressive LEE, increasing abd girth, anorexia, early satiety, and DOE.  He believes he gained ~ 40 lbs over the past 3-4 months. Echo 3/6 showed EF of 30-35%. ECG with poor R wave progression suggestive of prior anterior infarct. Bumped creat with IV bumex and was switched to lasix gtt on 3/8. Net negative 3.8L overnight and 7.45L since admission with significant drop in weight to 167 lbs this AM. Breathing better this AM. -Cont lasix gtt as he still has significant volume overload - he believes his baseline wt is ~ 245 lbs. -Cont  blocker -f/u BMET now. -No ACEI/ARB/ARNI/Spiro in setting of renal failure. -Add low dose hydral/nitrate for afterload reduction. -Cont coreg. -Will need to consider cath once volume stable dependent upon renal fxn.  May have to settle for nuclear testing.  2.  Presumed CKD III:  Creat 2.15 on admission - improved to 2.02 on 3/7 but then bumped to 2.07 following IV bumex. -F/u Creat this AM. -No ACEI/ARB/ARNI/Spiro for now.  3.  Newly Dx Type II DM: A1c 7.2. Sugars stable. -Per IM.  4.  Elevated troponin: 0.11  0.12 x 3. Suspect 2/2 demand ischemia in setting of CHF. Will need ischemic eval 2/2 LV dysfxn. -Cath vs myoview pending renal fxn (suspect renal dzs is  chronic).  Signed, Nicolasa Ducking NP

## 2016-01-10 NOTE — Care Management (Addendum)
Have faxed completed applications to [facility].  Patient today says he will consider having a home health nurse to to visit "a few times" but he is not sure.  Will ask [facility] to assess for charity care criteria

## 2016-01-10 NOTE — Progress Notes (Signed)
[facility] [place] [facility]   PATIENT NAME: Cristian Harderimothy Goodman    MR#:  [id number]  DATE OF BIRTH:  10/17/1956  SUBJECTIVE: Admitted for shortness of breath. Found to have new onset CHF. Patient is on IV Lasix. Denies any Chest pain. Has been having orthopnea and PND for more than a week.   Says she feels better today. Wants to go home. no Shortness of breath or pedal edema at this time.   CHIEF COMPLAINT:   Chief Complaint  Patient presents with  . Shortness of Breath  . Ascites   patient presents with shortness of breath as well as lower extremity swelling and ascites, he was initiated on Lasix intravenously, he has been diuresing well and lost approximately 6.4 L since admission. Patient feels good. Denies any shortness of breath and admits of improved lower extremity swelling.   REVIEW OF SYSTEMS:    Review of Systems  Constitutional: Negative for fever and chills.  HENT: Negative for hearing loss.   Eyes: Negative for blurred vision, double vision and photophobia.  Respiratory: Negative for cough, hemoptysis and shortness of breath.   Cardiovascular: Negative for palpitations, orthopnea, claudication and leg swelling.  Gastrointestinal: Negative for vomiting, abdominal pain and diarrhea.  Genitourinary: Negative for dysuria and urgency.  Musculoskeletal: Negative for myalgias and neck pain.  Skin: Negative for rash.  Neurological: Negative for dizziness, focal weakness, seizures, weakness and headaches.  Psychiatric/Behavioral: Negative for memory loss. The patient does not have insomnia.     Nutrition:  Tolerating Diet: Tolerating PT:      DRUG ALLERGIES:  No Known Allergies  VITALS:  Blood pressure 109/82, pulse 91, temperature 98 F (36.7 C), temperature source Oral, resp. rate 18, height 5\' 6"  (1.676 m), weight 75.796 kg (167 lb 1.6 oz), SpO2 94 %.  PHYSICAL EXAMINATION:   Physical Exam  GENERAL:  60 y.o.-year-old patient lying  in the bed with no acute distress.  EYES: Pupils equal, round, reactive to light and accommodation. No scleral icterus. Extraocular muscles intact.  HEENT: Head atraumatic, normocephalic. Oropharynx and nasopharynx clear.  NECK:  Supple, no jugular venous distention. No thyroid enlargement, no tenderness.  LUNGS: Normal breath sounds bilaterally, no wheezing, rales,rhonchi or crepitation. No use of accessory muscles of respiration.  CARDIOVASCULAR: S1, S2 normal. No murmurs, rubs, or gallops.  ABDOMEN: Soft, nontender. Bowel sounds present. No organomegaly or mass. Slightly distended with ascites. EXTREMITIES: No pedal edema, cyanosis, or clubbing.  NEUROLOGIC: Cranial nerves II through XII are intact. Muscle strength 5/5 in all extremities. Sensation intact. Gait not checked.  PSYCHIATRIC: The patient is alert and oriented x 3.  SKIN: No obvious rash, lesion, or ulcer.    LABORATORY PANEL:   CBC  Recent Labs Lab 01/08/16 0458  WBC 8.2  HGB 14.3  HCT 43.2  PLT 206   ------------------------------------------------------------------------------------------------------------------  Chemistries   Recent Labs Lab 01/07/16 1355  01/10/16 1022  NA 139  < > 141  K 4.8  < > 3.9  CL 105  < > 96*  CO2 28  < > 36*  GLUCOSE 163*  < > 131*  BUN 30*  < > 32*  CREATININE 2.15*  < > 2.07*  CALCIUM 9.1  < > 9.3  AST 46*  --   --   ALT 26  --   --   ALKPHOS 85  --   --   BILITOT 1.1  --   --   < > = values in  this interval not displayed. ------------------------------------------------------------------------------------------------------------------  Cardiac Enzymes  Recent Labs Lab 01/08/16 0057  TROPONINI 0.12*   ------------------------------------------------------------------------------------------------------------------  RADIOLOGY:  No results found.   ASSESSMENT AND PLAN:   Principal Problem:   Systolic and diastolic CHF, acute (HCC) Active Problems:    Dyspnea   Elevated troponin   Bilateral leg edema   Congestive dilated cardiomyopathy (HCC)   CKD (chronic kidney disease), stage III   1 acute systolic heart failure: Seen by cardiology, discussed options of cardiac catheter versus noninvasive Myoview stress test. Discussed with Dr. Kirke Corin, who recommended stress test as outpatient EF 35% by echo. Continue Lasix intravenously, Coreg. Advised about low salt diet, fluid restriction. He admited eating lots of salt,  given education about CHF and has follow-up appointment. At CHF clinic.  #2 elevated troponins likely due to CHF: No chest pain, needs stress test as outpatient  . #3 chronic renal failure, stage III: Baseline GFR  40, creatinine up to 2, 07, creatinine is relatively stable.  #4. Diabetes mellitus, hemoglobin A1c 7.2, get diabetic education, initiate patient on diabetic diet All the records are reviewed and case discussed with Care Management/Social Workerr. Management plans discussed with the patient, family and they are in agreement.  CODE STATUS: full  TOTAL TIME TAKING CARE OF THIS PATIENT: 35 minutes.  Discussed with care management and cardiologist, Dr. Kirke Corin POSSIBLE D/C IN 1-2 DAYS, DEPENDING ON CLINICAL CONDITION.   Katharina Caper M.D on 01/10/2016 at 4:59 PM  Between 7am to 6pm - Pager - [phone]  After 6pm go to [email or web address] - password [email or web address] [facility]  [facility]  Office  [phone]  CC: Primary care physician; No PCP Per Patient

## 2016-01-10 NOTE — Progress Notes (Signed)
Patient has no acute event overnight. Patient remained in NSR with stable VS. Denied pain, on Lasix gtt.

## 2016-01-11 ENCOUNTER — Encounter: Payer: Self-pay | Admitting: Nurse Practitioner

## 2016-01-11 DIAGNOSIS — E119 Type 2 diabetes mellitus without complications: Secondary | ICD-10-CM

## 2016-01-11 LAB — BASIC METABOLIC PANEL
Anion gap: 10 (ref 5–15)
BUN: 35 mg/dL — ABNORMAL HIGH (ref 6–20)
CALCIUM: 9.2 mg/dL (ref 8.9–10.3)
CO2: 35 mmol/L — AB (ref 22–32)
CREATININE: 2.16 mg/dL — AB (ref 0.61–1.24)
Chloride: 95 mmol/L — ABNORMAL LOW (ref 101–111)
GFR calc non Af Amer: 31 mL/min — ABNORMAL LOW (ref >60)
GFR, EST AFRICAN AMERICAN: 36 mL/min — AB (ref >60)
GLUCOSE: 100 mg/dL — AB (ref 65–99)
Potassium: 3.6 mmol/L (ref 3.5–5.1)
Sodium: 140 mmol/L (ref 135–145)

## 2016-01-11 MED ORDER — ISOSORBIDE DINITRATE 5 MG PO TABS: 5.0000 mg | ORAL_TABLET | Freq: Two times a day (BID) | ORAL | Status: DC

## 2016-01-11 MED ORDER — FUROSEMIDE 40 MG PO TABS: 40.0000 mg | ORAL_TABLET | Freq: Two times a day (BID) | ORAL | Status: DC

## 2016-01-11 MED ORDER — HYDRALAZINE HCL 10 MG PO TABS: 10.0000 mg | ORAL_TABLET | Freq: Two times a day (BID) | ORAL | Status: DC

## 2016-01-11 MED ORDER — CARVEDILOL 3.125 MG PO TABS: 3.1250 mg | ORAL_TABLET | Freq: Two times a day (BID) | ORAL | Status: DC

## 2016-01-11 MED ORDER — REGADENOSON 0.4 MG/5ML IV SOLN
0.4000 mg | Freq: Once | INTRAVENOUS | Status: DC
  Filled 2016-01-11: qty 5

## 2016-01-11 MED ORDER — FUROSEMIDE 40 MG PO TABS
40.0000 mg | ORAL_TABLET | Freq: Two times a day (BID) | ORAL | Status: DC
  Administered 2016-01-11: 40 mg via ORAL
  Filled 2016-01-11: qty 1

## 2016-01-11 NOTE — Discharge Summary (Signed)
[facility] - [place] at [facility]   PATIENT NAME: Cristian Goodman    MR#:  [id number]  DATE OF BIRTH:  17-Apr-1956  DATE OF ADMISSION:  01/07/2016 ADMITTING PHYSICIAN: Altamese Dilling, MD  DATE OF DISCHARGE: 01/11/2016  2:19 PM  PRIMARY CARE PHYSICIAN: No PCP Per Patient     ADMISSION DIAGNOSIS:  Dyspnea [R06.00] Elevated troponin [R79.89] Acute CHF (congestive heart failure) (HCC) [I50.9] Acute congestive heart failure, unspecified congestive heart failure type (HCC) [I50.9]  DISCHARGE DIAGNOSIS:  Principal Problem:   Systolic and diastolic CHF, acute (HCC) Active Problems:   Dyspnea   Elevated troponin   Bilateral leg edema   Congestive dilated cardiomyopathy (HCC)   CKD (chronic kidney disease), stage III   Diabetes mellitus (HCC)   SECONDARY DIAGNOSIS:   Past Medical History  Diagnosis Date  . Obesity   . Chronic systolic CHF (congestive heart failure) (HCC)     a. 01/2016 Echo: EF 30-35%, triv AI, mildly dil RV/RA, mod TR, small peric eff.  . Type II diabetes mellitus (HCC)     a. Dx 01/2016  . CKD (chronic kidney disease), stage III     a. First discovered 01/2016.    .pro HOSPITAL COURSE:   The patient presented with shortness of breath as well as lower extremity swelling and ascites, he was noted to have renal failure, CHF, echocardiogram revealed cardiomyopathy with ejection fraction of 30-35%, moderate tricuspid regurg, patient was seen by cardiologist and he was initiated on Lasix intravenously, diuresing well and lost approximately 11.8 L since admission. Patient's kidney function slightly worsened from the baseline with estimated GFR drifting down from 34 on admission to 31 on the day of discharge. Patient feels good. Denies any shortness of breath and admits of improved lower extremity swelling. He complains of [employer] conspiracy against him, I had long discussion of approximately 15 minutes with patient's sister, who confirmed the  family tried,  unsuccessfully to take him to see a psychiatrist for at least 20 years. Discussion by problem: #1. Acute systolic heart failure, patient was seen by cardiologist and recommended outpatient stress test, patient will be arranged to have cardiology follow-up within next few days. Patient was advised to continue Coreg, Lasix, hydralazine and Imdur, patient is to follow-up with CHF clinic as well. The patient was advised to follow his weights closely. #2. Elevated troponin, likely due to CHF, patient had no chest pains, however. Cardiologist recommended stress test as outpatient. Patient will be seeing cardiology in the next few days and schedule stress test as outpatient. #3. CK D stage III, patient's baseline GFR apparently is 40, creatinine, but slightly worse on IV Lasix infusion, it is recommended to follow patient's creatinine level closely and follow-up with nephrologist as outpatient. #4 diabetes mellitus, new diagnosis, hemoglobin A1c 7.2, patient had diabetic education while in the hospital, he is to continue diabetic diet, discussed with the patient's sister, who however, it has concerns about patient following diet #5 medical noncompliance, I am afraid that patient has significant psychiatric disease, preventing him from taking care of himself, he would benefit from a psychiatrist evaluation and follow-up as outpatient, however, he refuses and patient's family is unable to force him. I believe that patient would benefit from disability due to psychiatric disease and I communicated this to patient's family. I suggest patient's family to start small to initiate relationship with cardiology and nephrology first, then may be will be more receptive to psychiatric evaluation versus cardiologist as well as nephrologist observations  may help to obtain disability for this patient anyway without clear psychiatric diagnosis.  #6. Paranoia, psychiatry diagnosis is unclear. Could be chronic  schizophrenia, patient would benefit from psychiatrist evaluation. However, he refuses. Please see discussion above . Discussed this patient's sister for at least 15 minutes. Emotional support provided  DISCHARGE CONDITIONS:   Stable   CONSULTS OBTAINED:     DRUG ALLERGIES:  No Known Allergies  DISCHARGE MEDICATIONS:   Discharge Medication List as of 01/11/2016  1:42 PM    CONTINUE these medications which have CHANGED   Details  carvedilol (COREG) 3.125 MG tablet Take 1 tablet (3.125 mg total) by mouth 2 (two) times daily with a meal., Starting 01/11/2016, Until Discontinued, Print    furosemide (LASIX) 40 MG tablet Take 1 tablet (40 mg total) by mouth 2 (two) times daily., Starting 01/11/2016, Until Discontinued, Print    hydrALAZINE (APRESOLINE) 10 MG tablet Take 1 tablet (10 mg total) by mouth 2 (two) times daily., Starting 01/11/2016, Until Discontinued, Print    isosorbide dinitrate (ISORDIL) 5 MG tablet Take 1 tablet (5 mg total) by mouth 2 (two) times daily., Starting 01/11/2016, Until Discontinued, Print         DISCHARGE INSTRUCTIONS:    Patient is to follow-up with cardiology, nephrology as outpatient   If you experience worsening of your admission symptoms, develop shortness of breath, life threatening emergency, suicidal or homicidal thoughts you must seek medical attention immediately by calling 911 or calling your MD immediately  if symptoms less severe.  You Must read complete instructions/literature along with all the possible adverse reactions/side effects for all the Medicines you take and that have been prescribed to you. Take any new Medicines after you have completely understood and accept all the possible adverse reactions/side effects.   Please note  You were cared for by a hospitalist during your hospital stay. If you have any questions about your discharge medications or the care you received while you were in the hospital after you are discharged, you can  call the unit and asked to speak with the hospitalist on call if the hospitalist that took care of you is not available. Once you are discharged, your primary care physician will handle any further medical issues. Please note that NO REFILLS for any discharge medications will be authorized once you are discharged, as it is imperative that you return to your primary care physician (or establish a relationship with a primary care physician if you do not have one) for your aftercare needs so that they can reassess your need for medications and monitor your lab values.    Today   CHIEF COMPLAINT:   Chief Complaint  Patient presents with  . Shortness of Breath  . Ascites    HISTORY OF PRESENT ILLNESS:  Cristian Goodman  is a 60 y.o. male with no significant past medical history who hasn't seen physicians for years who presents to the hospital with complaints of lower extremity, abdominal swelling, some shortness of breath. He was noted to have CHF with mild pulmonary interstitial edema, small bilateral pleural effusions, no evidence of pneumonia on chest x-ray and admitted to the hospital. echocardiogram revealed cardiomyopathy with ejection fraction of 30-35%, moderate tricuspid regurg, patient was seen by cardiologist and he was initiated on Lasix intravenously, diuresing well and lost approximately 11.8 L since admission. Patient's kidney function slightly worsened from the baseline with estimated GFR drifting down from 34 on admission to 31 on the day of discharge. Patient feels good.  Denies any shortness of breath and admits of improved lower extremity swelling. He complains of [employer] conspiracy against him, I had long discussion of approximately 15 minutes with patient's sister, who confirmed the family tried,  unsuccessfully to take him to see a psychiatrist for at least 20 years. Discussion by problem: #1. Acute systolic heart failure, patient was seen by cardiologist and recommended outpatient stress  test, patient will be arranged to have cardiology follow-up within next few days. Patient was advised to continue Coreg, Lasix, hydralazine and Imdur, patient is to follow-up with CHF clinic as well. The patient was advised to follow his weights closely. #2. Elevated troponin, likely due to CHF, patient had no chest pains, however. Cardiologist recommended stress test as outpatient. Patient will be seeing cardiology in the next few days and schedule stress test as outpatient. #3. CK D stage III, patient's baseline GFR apparently is 40, creatinine, but slightly worse on IV Lasix infusion, it is recommended to follow patient's creatinine level closely and follow-up with nephrologist as outpatient. #4 diabetes mellitus, new diagnosis, hemoglobin A1c 7.2, patient had diabetic education while in the hospital, he is to continue diabetic diet, discussed with the patient's sister, who however, it has concerns about patient following diet #5 medical noncompliance, I am afraid that patient has significant psychiatric disease, preventing him from taking care of himself, he would benefit from a psychiatrist evaluation and follow-up as outpatient, however, he refuses and patient's family is unable to force him. I believe that patient would benefit from disability due to psychiatric disease and I communicated this to patient's family. I suggest patient's family to start small to initiate relationship with cardiology and nephrology first, then may be will be more receptive to psychiatric evaluation versus cardiologist as well as nephrologist observations may help to obtain disability for this patient anyway without clear psychiatric diagnosis.  #6. Paranoia, psychiatry diagnosis is unclear. Could be chronic schizophrenia, patient would benefit from psychiatrist evaluation. However, he refuses. Please see discussion above . Discussed this patient's sister for at least 15 minutes. Emotional support provided     VITAL SIGNS:   Blood pressure 115/83, pulse 95, temperature 97.6 F (36.4 C), temperature source Oral, resp. rate 20, height  (1.676 m), weight 71.215 kg (157 lb), SpO2 96 %.  I/O:   Intake/Output Summary (Last 24 hours) at 01/11/16 1725 Last data filed at 01/11/16 0830  Gross per 24 hour  Intake    600 ml  Output   3225 ml  Net  -2625 ml    PHYSICAL EXAMINATION:  GENERAL:  60 y.o.-year-old patient lying in the bed with no acute distress.  EYES: Pupils equal, round, reactive to light and accommodation. No scleral icterus. Extraocular muscles intact.  HEENT: Head atraumatic, normocephalic. Oropharynx and nasopharynx clear.  NECK:  Supple, no jugular venous distention. No thyroid enlargement, no tenderness.  LUNGS: Normal breath sounds bilaterally, no wheezing, rales,rhonchi or crepitation. No use of accessory muscles of respiration.  CARDIOVASCULAR: S1, S2 normal. No murmurs, rubs, or gallops.  ABDOMEN: Soft, non-tender, non-distended. Bowel sounds present. No organomegaly or mass.  EXTREMITIES: 2 plus pedal edema, cyanosis, or clubbing.  NEUROLOGIC: Cranial nerves II through XII are intact. Muscle strength 5/5 in all extremities. Sensation intact. Gait not checked.  PSYCHIATRIC: The patient is alert and oriented x 3.  he is paranoid and discuss his about [employer] CONSPIRACY AGAINST HIM  SKIN: No obvious rash, lesion, or ulcer.   DATA REVIEW:   CBC  Recent Labs Lab  01/08/16 0458  WBC 8.2  HGB 14.3  HCT 43.2  PLT 206    Chemistries   Recent Labs Lab 01/07/16 1355  01/11/16 0457  NA 139  < > 140  K 4.8  < > 3.6  CL 105  < > 95*  CO2 28  < > 35*  GLUCOSE 163*  < > 100*  BUN 30*  < > 35*  CREATININE 2.15*  < > 2.16*  CALCIUM 9.1  < > 9.2  AST 46*  --   --   ALT 26  --   --   ALKPHOS 85  --   --   BILITOT 1.1  --   --   < > = values in this interval not displayed.  Cardiac Enzymes  Recent Labs Lab 01/08/16 0057  TROPONINI 0.12*    Microbiology Results  No results  found for this or any previous visit.  RADIOLOGY:  No results found.  EKG:   Orders placed or performed during the hospital encounter of 01/07/16  . EKG 12-Lead  . EKG 12-Lead  . ED EKG within 10 minutes  . ED EKG within 10 minutes      Management plans discussed with the patient, family and they are in agreement.  CODE STATUS:  Code Status History    Date Active Date Inactive Code Status Order ID Comments User Context   01/07/2016  7:56 PM 01/11/2016  5:20 PM Full Code [id number]  Altamese DillingVaibhavkumar Vachhani, MD Inpatient      TOTAL TIME TAKING CARE OF THIS PATIENT: 60 minutes.    prolonged discussion with family and care management, discharge planning  Eila Runyan M.D on 01/11/2016 at 5:25 PM  Between 7am to 6pm - Pager - [phone]  After 6pm go to [email or web address] - password [email or web address] [facility]  [facility]  Office  [phone]  CC: Primary care physician; No PCP Per Patient

## 2016-01-11 NOTE — Progress Notes (Signed)
Case manager has spoken with pt's sister about how to apply for disability

## 2016-01-11 NOTE — Progress Notes (Signed)
Pt's sister spoke with Dr about all d/c instructions and forms. IV d.c telemetry taken off. Central telemetry was called. All Rx given to pt and sister

## 2016-01-11 NOTE — Progress Notes (Signed)
Brief Nutrition Note:   Dietitian Consult received for diabetes and heart failure education. Pt discharged today; written material on Heart Failure Nutrition Therapy and Carb Counting for People with Diabetes mailed to pt home.  Romelle Starcherate [name] MS, RD, LDN [phone] Pager  [phone] Weekend/On-Call Pager

## 2016-01-11 NOTE — Progress Notes (Signed)
Patient Name: GILLIE CRISCI Date of Encounter: 01/11/2016  Hospital Problem List     Principal Problem:   Systolic and diastolic CHF, acute [facility]) Active Problems:   Congestive dilated cardiomyopathy (HCC)   Dyspnea   Bilateral leg edema   CKD (chronic kidney disease), stage III   Elevated troponin    Subjective   Breathing much improved.  Still with some ankle edema but overall better.  No chest pain.  Ambulating w/o difficulty.  Hydral/nitrate started yesterday but hydral held this AM due to soft bp.  Inpatient Medications    . carvedilol  3.125 mg Oral BID WC  . enoxaparin (LOVENOX) injection  40 mg Subcutaneous Q24H  . furosemide  40 mg Oral BID  . hydrALAZINE  10 mg Oral 3 times per day  . isosorbide dinitrate  5 mg Oral BID  . sodium chloride flush  3 mL Intravenous Q12H    Vital Signs    Filed Vitals:   01/10/16 1125 01/10/16 2035 01/11/16 0457 01/11/16 0752  BP: 109/82 109/78 101/66 104/58  Pulse: 91 94 90 89  Temp: 98 F (36.7 C) 98.3 F (36.8 C) 98.1 F (36.7 C)   TempSrc: Oral Oral Oral   Resp:  18 20   Height:      Weight:   157 lb (71.215 kg)   SpO2: 94% 98% 94% 99%    Intake/Output Summary (Last 24 hours) at 01/11/16 1019 Last data filed at 01/11/16 0830  Gross per 24 hour  Intake    840 ml  Output   4525 ml  Net  -3685 ml   Filed Weights   01/09/16 0429 01/10/16 0433 01/11/16 0457  Weight: 176 lb 8 oz (80.06 kg) 167 lb 1.6 oz (75.796 kg) 157 lb (71.215 kg)    Physical Exam    General: Pleasant, NAD. Neuro: Alert and oriented X 3. Moves all extremities spontaneously. Psych: Normal affect. HEENT:  Normal  Neck: Supple without bruits or JVD. Lungs:  Resp regular and unlabored, CTA. Heart: RRR no s3, s4, or murmurs. Abdomen: Soft, non-tender, non-distended, BS + x 4.  Extremities: No clubbing, cyanosis.  1+ bilat ankle edema. DP/PT/Radials 2+ and equal bilaterally.  Labs    Basic Metabolic Panel  Recent Labs  01/10/16 1022  01/11/16 0457  NA 141 140  K 3.9 3.6  CL 96* 95*  CO2 36* 35*  GLUCOSE 131* 100*  BUN 32* 35*  CREATININE 2.07* 2.16*  CALCIUM 9.3 9.2    Telemetry    RSR  Radiology    Dg Chest 2 View  01/08/2016  CLINICAL DATA:  60 year old with bilateral lower extremity swelling for 1 month. Shortness of breath. Acute congestive heart failure. EXAM: CHEST  2 VIEW COMPARISON:  01/07/2016 radiographs. FINDINGS: Cardiomegaly, pulmonary edema and bilateral pleural effusions have mildly improved since yesterday's examination. There is associated mild bibasilar atelectasis, although no confluent airspace opacity. The bones appear unchanged. IMPRESSION: Interval improvement in mild congestive heart failure. Electronically Signed   By: Carey Bullocks M.D.   On: 01/08/2016 07:50   Dg Chest 2 View  01/07/2016  CLINICAL DATA:  Two months of shortness of breath especially with exertion, abdominal distention, and lower extremity edema. EXAM: CHEST  2 VIEW COMPARISON:  None in PACs FINDINGS: The lungs are well-expanded. The interstitial markings are increased bilaterally. There small bilateral pleural effusions blunting the costophrenic angles. The cardiac silhouette is enlarged. The pulmonary vascularity is indistinct. The trachea is midline. The bony thorax  exhibits no acute abnormality. IMPRESSION: CHF with mild pulmonary interstitial edema and small bilateral pleural effusions. There is no evidence of pneumonia. Electronically Signed   By: David  Swazil[place]Jordan M.D.   On: 01/07/2016 14:26    Assessment & Plan    1. Acute systolic CHF/Cardiomyopathy (? ICM vs NICM):  Admitted 3/6 with a three month h/o progressive LEE, increasing abd girth, anorexia, early satiety, and DOE. He believes he gained ~ 40 lbs over the past 3-4 months. Echo 3/6 showed EF of 30-35%. ECG with poor R wave progression suggestive of prior anterior infarct. Bumped creat with IV bumex and was switched to lasix gtt on 3/8. Net negative 4.7L  overnight and 11.8L since admission with significant drop in weight to 157 lbs this AM (admitted @ 183 lbs). BUN rising.  Creat also up. Convert to PO lasix today. -Cont  blocker  -No ACEI/ARB/ARNI/Spiro in setting of renal failure, which now appears to be chronic. -Low dose hydral/nitrate added yesterday.  BP's soft and this am's dose of hydral held.  Will decrease hydral to 10 BID. -Cont coreg. -With renal failure, which appears to be chronic, we will not be able to cath.  Instead, we will need MV.  I'm concerned that he will not f/u and thus ideally we should try and do this as an inpt.  He has eaten this am.  Will order for tomorrow.  If he is set on going home, I can see him in clinic on Monday and I can arrange nuc @ that time.  2. Presumed CKD III:  Creat 2.15 on admission - improved to 2.02 on 3/7 but then bumped to 2.07 following IV bumex. BUN/Creat rising.  CO2 35.  Suspect he is becoming contracted. -Switched to PO lasix today. -No ACEI/ARB/ARNI/Spiro in setting of newly dx CKD III.  3. Newly Dx Type II DM: A1c 7.2. Sugars stable. -Per IM.  4. Elevated troponin: 0.11  0.12 x 3. Suspect 2/2 demand ischemia in setting of CHF. -Will plan MV as above.  Unfortunately, CKD will prevent us from performing cath.  Signed, Nicolasa Duckinghristopher Lorita Forinash NP

## 2016-01-11 NOTE — Care Management (Signed)
Spoke with attending regarding psych consult for patient .  Discussed the long family history of family sheltering patient and not seeking or pressuring for .  Discussed that attending would consult with patient family members before proceeding with psych consult . Confirmed appointment at [facility] on 3/23 at 5:45p.  Scripts to [facility].  all on formulary.  Provided scales.

## 2016-01-14 ENCOUNTER — Encounter: Payer: Self-pay | Admitting: Cardiovascular Disease

## 2016-01-14 ENCOUNTER — Ambulatory Visit (INDEPENDENT_AMBULATORY_CARE_PROVIDER_SITE_OTHER): Payer: Self-pay | Admitting: Cardiovascular Disease

## 2016-01-14 VITALS — BP 94/64 | HR 89 | Ht 66.0 in | Wt 155.5 lb

## 2016-01-14 DIAGNOSIS — N183 Chronic kidney disease, stage 3 unspecified: Secondary | ICD-10-CM

## 2016-01-14 DIAGNOSIS — R600 Localized edema: Secondary | ICD-10-CM

## 2016-01-14 DIAGNOSIS — I509 Heart failure, unspecified: Secondary | ICD-10-CM

## 2016-01-14 DIAGNOSIS — R9431 Abnormal electrocardiogram [ECG] [EKG]: Secondary | ICD-10-CM

## 2016-01-14 DIAGNOSIS — I5041 Acute combined systolic (congestive) and diastolic (congestive) heart failure: Secondary | ICD-10-CM

## 2016-01-14 DIAGNOSIS — I420 Dilated cardiomyopathy: Secondary | ICD-10-CM

## 2016-01-14 DIAGNOSIS — E1122 Type 2 diabetes mellitus with diabetic chronic kidney disease: Secondary | ICD-10-CM

## 2016-01-14 MED ORDER — POTASSIUM CHLORIDE ER 10 MEQ PO TBCR: 10.0000 meq | EXTENDED_RELEASE_TABLET | Freq: Every day | ORAL | Status: DC

## 2016-01-14 MED ORDER — FUROSEMIDE 40 MG PO TABS: 40.0000 mg | ORAL_TABLET | Freq: Every day | ORAL | Status: AC

## 2016-01-14 NOTE — Progress Notes (Signed)
Patient ID: Cristian Goodman, male    DOB: February 29, 1956, 60 y.o.   MRN: [id number]  HPI Comments: Cristian Goodman is a 60 year old gentleman with history of mental health issues, possible paranoia versus schizophrenia (no accurate diagnosis made as patient has refused psychiatric evaluation), who presented to [facility] with worsening shortness of breath, leg swelling, weight gain, ascites, echocardiogram confirming cardiomyopathy with ejection fraction of 30-35%, with aggressive diuresis through his hospital course on Lasix infusion with close to 12 L net output, dramatic improvement in his weight and symptoms who presents for follow-up after recent discharge from the hospital for acute systolic CHF he does not have primary care physician. Notes indicating chronic kidney disease stage III, diabetes type 2 with hemoglobin A1c 7.2.  In follow up today, he presents with family Reports his weight was 156 when he left the hospital, now 151 pounds today Denies any significant shortness of breath, does have trace edema above the sock line, reports he has no orthopnea or PND, sleeping well. No anorexia as he had previously, feels he is at his baseline.  Long discussion today concerning the management of his heart failure while he was in the hospital We went over his echocardiogram results with him again  EKG on today's visit shows normal sinus rhythm with rate 89 bpm, old anterior MI   results of EKG discussed with him and need for stress testing  Lab work reviewed from the hospital showing climb in his BUN and creatinine, last BMP was 3 days ago Also with decline in his potassium He is currently not taking any supplemental potassium. Friend who accompanies him today reports he is not drinking very much at home, only with meals  He does not have appointment with nephrology Blood pressure running low but he denies any orthostasis    No Known Allergies  Current Outpatient Prescriptions on File Prior to Visit   Medication Sig Dispense Refill  . carvedilol (COREG) 3.125 MG tablet Take 1 tablet (3.125 mg total) by mouth 2 (two) times daily with a meal. 30 tablet 0  . hydrALAZINE (APRESOLINE) 10 MG tablet Take 1 tablet (10 mg total) by mouth 2 (two) times daily. 60 tablet 3  . isosorbide dinitrate (ISORDIL) 5 MG tablet Take 1 tablet (5 mg total) by mouth 2 (two) times daily. (Patient taking differently: Take 2.5 mg by mouth daily. ) 60 tablet 3   No current facility-administered medications on file prior to visit.    Past Medical History  Diagnosis Date  . Obesity   . Chronic systolic CHF (congestive heart failure) (HCC)     a. 01/2016 Echo: EF 30-35%, triv AI, mildly dil RV/RA, mod TR, small peric eff.  . Type II diabetes mellitus (HCC)     a. Dx 01/2016  . CKD (chronic kidney disease), stage III     a. First discovered 01/2016.    Past Surgical History  Procedure Laterality Date  . Appendectomy      as a child ~ age 80  . 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 He does not have any smokeless tobacco history on file. He reports that he does not drink alcohol or use illicit drugs.  Family History family history includes Alzheimer's disease in his mother; CAD in his father; Diabetes in his father.   Review of Systems  Constitutional: Negative.   Respiratory: Negative.   Cardiovascular: Negative.  Gastrointestinal: Negative.   Musculoskeletal: Negative.   Neurological: Negative.   Hematological: Negative.   Psychiatric/Behavioral: Negative.   All other systems reviewed and are negative.   BP 94/64 mmHg  Pulse 89  Ht 5\' 6"  (1.676 m)  Wt 155 lb 8 oz (70.534 kg)  BMI 25.11 kg/m2  Physical Exam  Constitutional: He is oriented to person, place, and time. He appears well-developed and well-nourished.  HENT:  Head: Normocephalic.  Nose: Nose normal.  Mouth/Throat: Oropharynx is clear and moist.  Eyes: Conjunctivae are normal.  Pupils are equal, round, and reactive to light.  Neck: Normal range of motion. Neck supple. No JVD present.  Cardiovascular: Normal rate, regular rhythm, normal heart sounds and intact distal pulses.  Exam reveals no gallop and no friction rub.   No murmur heard. Trace edema above the sock line  Pulmonary/Chest: Effort normal and breath sounds normal. No respiratory distress. He has no wheezes. He has no rales. He exhibits no tenderness.  Abdominal: Soft. Bowel sounds are normal. He exhibits no distension. There is no tenderness.  Musculoskeletal: Normal range of motion. He exhibits no edema or tenderness.  Lymphadenopathy:    He has no cervical adenopathy.  Neurological: He is alert and oriented to person, place, and time. Coordination normal.  Skin: Skin is warm and dry. No rash noted. No erythema.  Psychiatric: He has a normal mood and affect. His behavior is normal. Judgment and thought content normal.

## 2016-01-14 NOTE — Patient Instructions (Addendum)
You are doing well.  Please decrease the Furosemide/lasix down to one a day Drink a little more fluids  No furosemide/lasix if your weight is less than 150 pounds Goal weight is 155 pounds  We will schedule a stress test, lexiscan myoview Hold your medications the morning of the test No food the morning of the stress test   Please take potassium daily  Please call us if you have new issues that need to be addressed before your next appt.  Your physician wants you to follow-up in: 3 months.  You will receive a reminder letter in the mail two months in advance. If you don't receive a letter, please call our office to schedule the follow-up appointment.      [facility] MYOVIEW  Your caregiver has ordered a Stress Test with nuclear imaging. The purpose of this test is to evaluate the blood supply to your heart muscle. This procedure is referred to as a "Non-Invasive Stress Test." This is because other than having an IV started in your vein, nothing is inserted or "invades" your body. Cardiac stress tests are done to find areas of poor blood flow to the heart by determining the extent of coronary artery disease (CAD). Some patients exercise on a treadmill, which naturally increases the blood flow to your heart, while others who are  unable to walk on a treadmill due to physical limitations have a pharmacologic/chemical stress agent called Lexiscan . This medicine will mimic walking on a treadmill by temporarily increasing your coronary blood flow.   Please note: these test may take anywhere between 2-4 hours to complete  [facility]  THE VOLUNTEERS AT THE FIRST DESK WILL DIRECT YOU WHERE TO GO  Date of Procedure:_____March 21, 2017 at 07:30 AM_______________  Arrival Time for Procedure:______Arrive at 07:15 AM_____________  Instructions regarding medication:   HOLD MEDICATIONS THE MORNING OF TEST.   PLEASE NOTIFY THE OFFICE AT LEAST 24 HOURS IN ADVANCE IF  YOU ARE UNABLE TO KEEP YOUR APPOINTMENT.  [phone] AND  PLEASE NOTIFY [facility]IC[facility] AT [facility] AT LEAST 24 HOURS IN ADVANCE IF YOU ARE UNABLE TO KEEP YOUR APPOINTMENT. [phone]  How to prepare for your Myoview test:  1. Do not eat or drink after midnight 2. No caffeine for 24 hours prior to test 3. No smoking 24 hours prior to test. 4. Your medication may be taken with water.  If your doctor stopped a medication because of this test, do not take that medication. 5. Ladies, please do not wear dresses.  Skirts or pants are appropriate. Please wear a short sleeve shirt. 6. No perfume, cologne or lotion. 7. Wear comfortable walking shoes. No heels!

## 2016-01-14 NOTE — Assessment & Plan Note (Addendum)
In follow-up we will try to arrange an appointment with nephrology He currently does not have any insurance Etiology of his renal failure is unclear

## 2016-01-14 NOTE — Assessment & Plan Note (Signed)
Trace edema above the sock line, dramatically improved since his admission

## 2016-01-14 NOTE — Assessment & Plan Note (Addendum)
EKG concerning for previous MI, anterior Stress test has been ordered Depressed ejection fraction with cardiomyopathy   Total encounter time more than 25 minutes  Greater than 50% was spent in counseling and coordination of care with the patient

## 2016-01-14 NOTE — Assessment & Plan Note (Signed)
We have encouraged continued exercise, careful diet management in an effort to lose weight. 

## 2016-01-14 NOTE — Assessment & Plan Note (Signed)
Dramatic improvement in his heart failure symptoms I'm concerned about further weight loss since his discharge from the hospital Recommended he decrease Lasix down to 40 mg daily Recommended he start potassium 10 mEq daily Suggested: Weight 153 up to 155 pounds Weight is currently 151 pounds Recommended he drink increased amount of fluids as he has been very strict with his fluid intake

## 2016-01-14 NOTE — Assessment & Plan Note (Signed)
Etiology of his depressed ejection fraction is unclear, EKG concerning for old anterior MI Recommended a pharmacologic Myoview. Likely unable to treadmill Not a candidate for cardiac catheterization given his renal failure

## 2016-01-21 ENCOUNTER — Other Ambulatory Visit: Payer: Self-pay

## 2016-01-22 ENCOUNTER — Encounter
Admission: RE | Admit: 2016-01-22 | Discharge: 2016-01-22 | Disposition: A | Discharge status: Home / Self Care | Payer: Self-pay | Source: Ambulatory Visit | Attending: Cardiovascular Disease | Admitting: Cardiovascular Disease

## 2016-01-22 DIAGNOSIS — I420 Dilated cardiomyopathy: Secondary | ICD-10-CM

## 2016-01-22 MED ORDER — REGADENOSON 0.4 MG/5ML IV SOLN
0.4000 mg | Freq: Once | INTRAVENOUS | Status: AC
  Administered 2016-01-22: 0.4 mg via INTRAVENOUS

## 2016-01-22 MED ORDER — TECHNETIUM TC 99M SESTAMIBI - CARDIOLITE
30.8220 | Freq: Once | INTRAVENOUS | Status: AC | PRN
  Administered 2016-01-22: 09:00:00 30.822 via INTRAVENOUS

## 2016-01-22 MED ORDER — TECHNETIUM TC 99M SESTAMIBI - CARDIOLITE
12.5800 | Freq: Once | INTRAVENOUS | Status: AC | PRN
  Administered 2016-01-22: 07:00:00 12.582 via INTRAVENOUS

## 2016-01-23 ENCOUNTER — Ambulatory Visit: Payer: Self-pay

## 2016-01-23 LAB — NM MYOCAR MULTI W/SPECT W/WALL MOTION / EF
CSEPPHR: 105 {beats}/min
LV dias vol: 202 mL (ref 62–150)
LVSYSVOL: 156 mL
Percent HR: 65 %
Rest HR: 93 {beats}/min
SDS: 3
SRS: 15
SSS: 15
TID: 1.07

## 2016-01-24 ENCOUNTER — Ambulatory Visit: Payer: Self-pay | Admitting: Urology

## 2016-01-24 VITALS — BP 121/79 | HR 99 | Wt 157.0 lb

## 2016-01-24 DIAGNOSIS — Z131 Encounter for screening for diabetes mellitus: Secondary | ICD-10-CM

## 2016-01-24 DIAGNOSIS — I5040 Unspecified combined systolic (congestive) and diastolic (congestive) heart failure: Secondary | ICD-10-CM

## 2016-01-24 DIAGNOSIS — N183 Chronic kidney disease, stage 3 (moderate): Secondary | ICD-10-CM

## 2016-01-24 DIAGNOSIS — E1122 Type 2 diabetes mellitus with diabetic chronic kidney disease: Secondary | ICD-10-CM

## 2016-01-24 DIAGNOSIS — E1365 Other specified diabetes mellitus with hyperglycemia: Secondary | ICD-10-CM

## 2016-01-24 DIAGNOSIS — E138 Other specified diabetes mellitus with unspecified complications: Secondary | ICD-10-CM

## 2016-01-24 DIAGNOSIS — IMO0002 Reserved for concepts with insufficient information to code with codable children: Secondary | ICD-10-CM

## 2016-01-24 LAB — GLUCOSE, POCT (MANUAL RESULT ENTRY): POC Glucose: 142 mg/dl — AB (ref 70–99)

## 2016-01-24 MED ORDER — CARVEDILOL 3.125 MG PO TABS: 3.1250 mg | ORAL_TABLET | Freq: Two times a day (BID) | ORAL | Status: DC

## 2016-01-24 NOTE — Progress Notes (Signed)
       Patient: Cristian Goodman Male    DOB: Apr 13, 1956   60 y.o.   MRN: [id number] Visit Date: 01/24/2016  Today's Provider: [facility]   Chief Complaint  Patient presents with  . establish PCP   Subjective:    HPI Patient was admitted for CHF last week.  He has not had a previous episode of CHF.  He does not have a history of HTN or alcoholism. He has not seen a doctor in over 40 years.  He has a psych history, but undiagnosed.    Patient has not had chest pain.  He is not gaining weight.      No Known Allergies Previous Medications   FUROSEMIDE (LASIX) 40 MG TABLET    Take 1 tablet (40 mg total) by mouth daily.   HYDRALAZINE (APRESOLINE) 10 MG TABLET    Take 1 tablet (10 mg total) by mouth 2 (two) times daily.   ISOSORBIDE DINITRATE (ISORDIL) 5 MG TABLET    Take 1 tablet (5 mg total) by mouth 2 (two) times daily.   POTASSIUM CHLORIDE (K-DUR) 10 MEQ TABLET    Take 1 tablet (10 mEq total) by mouth daily.    Review of Systems  Social History  Substance Use Topics  . Smoking status: Never Smoker   . Smokeless tobacco: Not on file  . Alcohol Use: No   Objective:   BP 121/79 mmHg  Pulse 99  Wt 157 lb (71.215 kg)  Physical Exam Heart:  S1, S2 without murmurs, rubs or gallops Lung:  CTAB Abd:  S, NT, ND     Assessment & Plan:    1. CHF:  Patient completed his nuclear stress test on the 21st.  He has not had the appointment to discuss the results.  He has an appointment on the 31st with the heart failure clinic.  Coreg refilled at this appointment as it is listed as a medication to continue to take.       2. CKD, stage III:   Needs a referral to nephrology.    3. DM:  Needs a referral to endo night.      [facility]

## 2016-02-01 ENCOUNTER — Encounter: Payer: Self-pay | Admitting: Family

## 2016-02-01 ENCOUNTER — Ambulatory Visit: Payer: Self-pay | Attending: Family | Admitting: Family

## 2016-02-01 VITALS — BP 126/83 | HR 102 | Resp 20 | Ht 66.0 in | Wt 156.0 lb

## 2016-02-01 DIAGNOSIS — Z79899 Other long term (current) drug therapy: Secondary | ICD-10-CM | POA: Insufficient documentation

## 2016-02-01 DIAGNOSIS — Z8249 Family history of ischemic heart disease and other diseases of the circulatory system: Secondary | ICD-10-CM | POA: Insufficient documentation

## 2016-02-01 DIAGNOSIS — E1122 Type 2 diabetes mellitus with diabetic chronic kidney disease: Secondary | ICD-10-CM | POA: Insufficient documentation

## 2016-02-01 DIAGNOSIS — N183 Chronic kidney disease, stage 3 unspecified: Secondary | ICD-10-CM

## 2016-02-01 DIAGNOSIS — R000 Tachycardia, unspecified: Secondary | ICD-10-CM | POA: Insufficient documentation

## 2016-02-01 DIAGNOSIS — Z9889 Other specified postprocedural states: Secondary | ICD-10-CM | POA: Insufficient documentation

## 2016-02-01 DIAGNOSIS — Z6825 Body mass index (BMI) 25.0-25.9, adult: Secondary | ICD-10-CM | POA: Insufficient documentation

## 2016-02-01 DIAGNOSIS — I5022 Chronic systolic (congestive) heart failure: Secondary | ICD-10-CM | POA: Insufficient documentation

## 2016-02-01 DIAGNOSIS — Z833 Family history of diabetes mellitus: Secondary | ICD-10-CM | POA: Insufficient documentation

## 2016-02-01 DIAGNOSIS — E669 Obesity, unspecified: Secondary | ICD-10-CM | POA: Insufficient documentation

## 2016-02-01 DIAGNOSIS — Z820 Family history of epilepsy and other diseases of the nervous system: Secondary | ICD-10-CM | POA: Insufficient documentation

## 2016-02-01 NOTE — Patient Instructions (Signed)
Continue weighing daily and call for an overnight weight gain of > 2 pounds or a weekly weight gain of >5 pounds. 

## 2016-02-01 NOTE — Progress Notes (Signed)
Subjective:    Patient ID: Cristian Goodman, male    DOB: November 25, 1955, 60 y.o.   MRN: [id number]  Congestive Heart Failure Presents for initial visit. The disease course has been improving. Pertinent negatives include no abdominal pain, chest pain, edema, fatigue, muscle weakness, orthopnea, palpitations or shortness of breath. The symptoms have been improving. Past treatments include beta blockers and salt and fluid restriction. The treatment provided moderate relief. Compliance with prior treatments has been good. His past medical history is significant for DM. There is no history of CVA or HTN. He has one 1st degree relative with heart disease.  Other This is a new (tachycardia) problem. The problem occurs intermittently. Pertinent negatives include no abdominal pain, chest pain, congestion, coughing, fatigue, headaches, nausea, neck pain, sore throat or weakness. Nothing aggravates the symptoms. He has tried nothing for the symptoms.   Past Medical History  Diagnosis Date  . Obesity   . Chronic systolic CHF (congestive heart failure) (HCC)     a. 01/2016 Echo: EF 30-35%, triv AI, mildly dil RV/RA, mod TR, small peric eff.  . Type II diabetes mellitus (HCC)     a. Dx 01/2016  . CKD (chronic kidney disease), stage III     a. First discovered 01/2016.    Past Surgical History  Procedure Laterality Date  . Appendectomy      as a child ~ age 60  . 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 Alzheimer's disease Mother     died @ 5080.  . Diabetes Father     alive @ 890  . CAD Father     Social History  Substance Use Topics  . Smoking status: Never Smoker   . Smokeless tobacco: Never Used  . Alcohol Use: No    No Known Allergies  Prior to Admission medications   Medication Sig Start Date End Date Taking? Authorizing Provider  carvedilol (COREG) 3.125 MG tablet Take 1 tablet (3.125 mg total) by mouth 2 (two) times daily with a meal.  01/24/16  Yes Shannon A McGowan, PA-C  furosemide (LASIX) 40 MG tablet Take 1 tablet (40 mg total) by mouth daily. 01/14/16  Yes Antonieta Ibaimothy J Gollan, MD  hydrALAZINE (APRESOLINE) 10 MG tablet Take 1 tablet (10 mg total) by mouth 2 (two) times daily. 01/11/16  Yes Katharina Caperima Vaickute, MD  isosorbide mononitrate (IMDUR) 30 MG 24 hr tablet Take 15 mg by mouth daily.   Yes Historical Provider, MD  potassium chloride (K-DUR) 10 MEQ tablet Take 1 tablet (10 mEq total) by mouth daily. 01/14/16  Yes Antonieta Ibaimothy J Gollan, MD      Review of Systems  Constitutional: Negative for appetite change and fatigue.  HENT: Negative for congestion, postnasal drip and sore throat.   Eyes: Negative.   Respiratory: Negative for cough, chest tightness and shortness of breath.   Cardiovascular: Negative for chest pain, palpitations and leg swelling.  Gastrointestinal: Negative for nausea, abdominal pain and abdominal distention.  Endocrine: Negative.   Genitourinary: Negative.   Musculoskeletal: Negative for back pain, muscle weakness and neck pain.  Skin: Negative.   Allergic/Immunologic: Negative.   Neurological: Negative for dizziness, weakness, light-headedness and headaches.  Hematological: Negative for adenopathy. Does not bruise/bleed easily.  Psychiatric/Behavioral: Negative for sleep disturbance (sleeping flat without pillows) and dysphoric mood. The patient is not nervous/anxious.        Objective:   Physical  Exam  Constitutional: He is oriented to person, place, and time. He appears well-developed and well-nourished.  HENT:  Head: Normocephalic and atraumatic.  Eyes: Conjunctivae are normal. Pupils are equal, round, and reactive to light.  Neck: Normal range of motion. Neck supple.  Cardiovascular: Regular rhythm.  Tachycardia present.   Pulmonary/Chest: Effort normal. He has no wheezes. He has no rales.  Abdominal: Soft. He exhibits no distension. There is no tenderness.  Musculoskeletal: He exhibits no edema  or tenderness.  Neurological: He is alert and oriented to person, place, and time.  Skin: Skin is warm and dry.  Psychiatric: He has a normal mood and affect. His behavior is normal. Thought content normal.  Nursing note and vitals reviewed.   BP 126/83 mmHg  Pulse 102  Resp 20  Ht  (1.676 m)  Wt 156 lb (70.761 kg)  BMI 25.19 kg/m2  SpO2 100%       Assessment & Plan:  1: Chronic heart failure with reduced ejection fraction- Patient presents without any fatigue, shortness of breath or edema (Class I). He is already weighing himself daily and says that his weight has been stable. Encouraged him to call for an overnight weight gain of >2 pounds or a weekly weight gain of >5 pounds. He is not adding any salt to his food and tries to closely follow a  sodium diet. He does say that he gets hungry "all the time" because he's trying to limit his intake due to the sodium. Discussed the importance of following the  sodium diet closely and to try to eat more fresh fruit/vegetables. Written information was given to him regarding a low sodium diet. Has seen his cardiologist. He is currently taking 1/2 tablet of his  isosorbide daily. Encouraged him to use a pill cutter to cleanly break the tablet.  2: Tachycardia- Patient's heart rate is in the 100's. Discussed possibly increasing carvedilol at his next office visit if his heart rate remains elevated.  3: Chronic kidney disease- Patient's renal function is decreased and there's been discussion about a nephrology referral.   Return here in 1 month or sooner for any questions/problems before the next office visit.

## 2016-02-12 ENCOUNTER — Telehealth: Payer: Self-pay | Admitting: Cardiovascular Disease

## 2016-02-12 NOTE — Telephone Encounter (Signed)
Received records request from [employer], [facility] on 02/12/16

## 2016-02-26 ENCOUNTER — Ambulatory Visit: Payer: Self-pay | Admitting: Urology

## 2016-02-26 VITALS — BP 138/80 | HR 59 | Wt 158.0 lb

## 2016-02-26 DIAGNOSIS — N183 Chronic kidney disease, stage 3 unspecified: Secondary | ICD-10-CM

## 2016-02-26 DIAGNOSIS — E1122 Type 2 diabetes mellitus with diabetic chronic kidney disease: Secondary | ICD-10-CM

## 2016-02-26 DIAGNOSIS — I5022 Chronic systolic (congestive) heart failure: Secondary | ICD-10-CM

## 2016-02-26 MED ORDER — CARVEDILOL 3.125 MG PO TABS: 3.1250 mg | ORAL_TABLET | Freq: Two times a day (BID) | ORAL | Status: DC

## 2016-02-26 NOTE — Progress Notes (Signed)
Patient: Cristian Goodman Male    DOB: Jun 18, 1956   60 y.o.   MRN: [id number] Visit Date: 02/26/2016  Today's Provider: [facility]   Chief Complaint  Patient presents with  . Follow-up    One month follow up.   . Diabetes  . Congestive Heart Failure   Subjective:    Diabetes He presents for his follow-up diabetic visit. He has type 2 diabetes mellitus. No MedicAlert identification noted. The initial diagnosis of diabetes was made 2 months ago. His disease course has been stable. Pertinent negatives for diabetes include no chest pain and no fatigue.  Congestive Heart Failure Presents for follow-up visit. The disease course has been stable. The condition has lasted for 2 months. Pertinent negatives include no abdominal pain, chest pain, chest pressure, claudication, edema, fatigue, muscle weakness, near-syncope, nocturia, orthopnea, palpitations, shortness of breath or unexpected weight change. The symptoms have been improving. Past treatments include salt and fluid restriction and weight loss. The treatment provided significant relief. Compliance with prior treatments has been good. Prior compliance problems include insurance issues. His past medical history is significant for CAD, DM and HTN. There is no history of anemia, arrhythmia, chronic lung disease, CVA, DVT, myocarditis, PE or valvular heart disease. He has one 1st degree relative with heart disease. Compliance with total regimen is 76-100%. Compliance problems include medication cost and adherence to diet.  Compliance with diet is 76-100%. Compliance with exercise is 76-100%. Compliance with medications is 76-100%.       No Known Allergies Previous Medications   CARVEDILOL (COREG) 3.125 MG TABLET    Take 1 tablet (3.125 mg total) by mouth 2 (two) times daily with a meal.   FUROSEMIDE (LASIX) 40 MG TABLET    Take 1 tablet (40 mg total) by mouth daily.   HYDRALAZINE (APRESOLINE) 10 MG TABLET    Take 1 tablet (10  mg total) by mouth 2 (two) times daily.   ISOSORBIDE MONONITRATE (IMDUR) 30 MG 24 HR TABLET    Take 15 mg by mouth daily.   POTASSIUM CHLORIDE (K-DUR) 10 MEQ TABLET    Take 1 tablet (10 mEq total) by mouth daily.    Review of Systems  Constitutional: Negative for fatigue and unexpected weight change.  Respiratory: Negative for shortness of breath.   Cardiovascular: Negative for chest pain, palpitations, claudication and near-syncope.  Gastrointestinal: Negative for abdominal pain.  Genitourinary: Negative for nocturia.  Musculoskeletal: Negative for muscle weakness.    Social History  Substance Use Topics  . Smoking status: Never Smoker   . Smokeless tobacco: Never Used  . Alcohol Use: No   Objective:   BP 138/80 mmHg  Pulse 59  Wt 158 lb (71.668 kg)  Physical Exam Constitutional: Well nourished. Alert and oriented, No acute distress. HEENT: [place] AT, moist mucus membranes. Trachea midline, no masses. Cardiovascular: No clubbing, cyanosis, or edema. Respiratory: Normal respiratory effort, no increased work of breathing. GI: Abdomen is soft, non tender, non distended, no abdominal masses. Liver and spleen not palpable.  No hernias appreciated.  Stool sample for occult testing is not indicated.   GU: No CVA tenderness.  No bladder fullness or masses.   Skin: No rashes, bruises or suspicious lesions. Lymph: No cervical or inguinal adenopathy. Neurologic: Grossly intact, no focal deficits, moving all 4 extremities. Psychiatric: Normal mood and affect.      Assessment & Plan:     1. Chronic heart failure with reduced ejection fraction (Class I)  -  Encouraged him to go to ED/contact cardiology for an overnight weight gain of >2 pounds or a weekly weight gain of >5 pounds.  -Following the 2000mg  sodium diet closely   -TSH  -appointment with Dr. Odis LusterGollam (cardiology) on 04/16/2016   2. History of tachycardia  -normal pulse today; continue to monitor  -appointment with Clarisa Kindredina  Hackney, NP tomorrow (02/27/2016) at the Heart Failure clinic  3. DM  -appointment with Endocrinology on 03/18/2016  4. CKD, stage III  -nephrology consult is pending  -recheck BMP today      [facility]  [facility]

## 2016-02-27 ENCOUNTER — Encounter: Payer: Self-pay | Admitting: Family

## 2016-02-27 ENCOUNTER — Ambulatory Visit: Payer: Self-pay | Attending: Family | Admitting: Family

## 2016-02-27 VITALS — BP 123/86 | HR 92 | Resp 18 | Ht 66.0 in | Wt 156.0 lb

## 2016-02-27 DIAGNOSIS — Z79899 Other long term (current) drug therapy: Secondary | ICD-10-CM | POA: Insufficient documentation

## 2016-02-27 DIAGNOSIS — I5022 Chronic systolic (congestive) heart failure: Secondary | ICD-10-CM | POA: Insufficient documentation

## 2016-02-27 DIAGNOSIS — N183 Chronic kidney disease, stage 3 unspecified: Secondary | ICD-10-CM

## 2016-02-27 DIAGNOSIS — R000 Tachycardia, unspecified: Secondary | ICD-10-CM | POA: Insufficient documentation

## 2016-02-27 DIAGNOSIS — E669 Obesity, unspecified: Secondary | ICD-10-CM | POA: Insufficient documentation

## 2016-02-27 DIAGNOSIS — Z9081 Acquired absence of spleen: Secondary | ICD-10-CM | POA: Insufficient documentation

## 2016-02-27 DIAGNOSIS — E119 Type 2 diabetes mellitus without complications: Secondary | ICD-10-CM | POA: Insufficient documentation

## 2016-02-27 LAB — BASIC METABOLIC PANEL
BUN / CREAT RATIO: 15 (ref 10–24)
BUN: 26 mg/dL (ref 8–27)
CALCIUM: 9.7 mg/dL (ref 8.6–10.2)
CO2: 26 mmol/L (ref 18–29)
Chloride: 96 mmol/L (ref 96–106)
Creatinine, Ser: 1.69 mg/dL — ABNORMAL HIGH (ref 0.76–1.27)
GFR, EST AFRICAN AMERICAN: 50 mL/min/{1.73_m2} — AB (ref >59)
GFR, EST NON AFRICAN AMERICAN: 43 mL/min/{1.73_m2} — AB (ref >59)
Glucose: 126 mg/dL — ABNORMAL HIGH (ref 65–99)
POTASSIUM: 4.7 mmol/L (ref 3.5–5.2)
Sodium: 138 mmol/L (ref 134–144)

## 2016-02-27 LAB — TSH: TSH: 2.88 u[IU]/mL (ref 0.450–4.500)

## 2016-02-27 NOTE — Progress Notes (Signed)
Subjective:    Patient ID: Cristian Goodman, male    DOB: 1956-04-28, 60 y.o.   MRN: [id number]  Congestive Heart Failure Presents for follow-up visit. The disease course has been stable. Pertinent negatives include no abdominal pain, chest pain, chest pressure, edema, fatigue, orthopnea, palpitations or shortness of breath. The symptoms have been stable. Past treatments include beta blockers and salt and fluid restriction. The treatment provided significant relief. Compliance with prior treatments has been good. His past medical history is significant for DM. There is no history of CVA or HTN. He has one 1st degree relative with heart disease.  Other This is a recurrent (tachycardia) problem. The current episode started today. The problem occurs intermittently. Associated symptoms include neck pain. Pertinent negatives include no abdominal pain, chest pain, congestion, coughing, fatigue, headaches, nausea, numbness, sore throat or weakness. Nothing aggravates the symptoms. He has tried nothing for the symptoms.    Past Medical History  Diagnosis Date  . Obesity   . Chronic systolic CHF (congestive heart failure) (HCC)     a. 01/2016 Echo: EF 30-35%, triv AI, mildly dil RV/RA, mod TR, small peric eff.  . Type II diabetes mellitus (HCC)     a. Dx 01/2016  . CKD (chronic kidney disease), stage III     a. First discovered 01/2016.    Past Surgical History  Procedure Laterality Date  . Appendectomy      as a child ~ age 36  . 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 Alzheimer's disease Mother     died @ 32.  . Diabetes Father     alive @ 87  . CAD Father     Social History  Substance Use Topics  . Smoking status: Never Smoker   . Smokeless tobacco: Never Used  . Alcohol Use: No    No Known Allergies  Prior to Admission medications   Medication Sig Start Date End Date Taking? Authorizing Provider  carvedilol (COREG) 3.125 MG  tablet Take 1 tablet (3.125 mg total) by mouth 2 (two) times daily with a meal. 02/26/16  Yes Shannon A McGowan, PA-C  furosemide (LASIX) 40 MG tablet Take 1 tablet (40 mg total) by mouth daily. 01/14/16  Yes Antonieta Iba, MD  hydrALAZINE (APRESOLINE) 10 MG tablet Take 1 tablet (10 mg total) by mouth 2 (two) times daily. 01/11/16  Yes Katharina Caper, MD  isosorbide mononitrate (IMDUR) 30 MG 24 hr tablet Take 15 mg by mouth daily.   Yes Historical Provider, MD  potassium chloride (K-DUR) 10 MEQ tablet Take 1 tablet (10 mEq total) by mouth daily. 01/14/16  Yes Antonieta Iba, MD     Review of Systems  Constitutional: Negative for appetite change and fatigue.  HENT: Negative for congestion, postnasal drip and sore throat.   Eyes: Negative.   Respiratory: Negative for cough, chest tightness and shortness of breath.   Cardiovascular: Negative for chest pain, palpitations and leg swelling.  Gastrointestinal: Negative for nausea, abdominal pain and abdominal distention.  Endocrine: Negative.   Genitourinary: Negative.   Musculoskeletal: Positive for neck pain. Negative for back pain.  Skin: Negative.   Allergic/Immunologic: Negative.   Neurological: Negative for dizziness, weakness, light-headedness, numbness and headaches.  Hematological: Negative for adenopathy. Does not bruise/bleed easily.  Psychiatric/Behavioral: Negative for sleep disturbance (sleeping flat in the bed) and dysphoric mood. The patient is not nervous/anxious.  Objective:   Physical Exam  Constitutional: He is oriented to person, place, and time. He appears well-developed and well-nourished.  HENT:  Head: Normocephalic and atraumatic.  Eyes: Conjunctivae are normal. Pupils are equal, round, and reactive to light.  Neck: Normal range of motion. Neck supple.  Cardiovascular: Regular rhythm.  Tachycardia present.   Pulmonary/Chest: Effort normal. He has no wheezes. He has no rales.  Abdominal: Soft. He exhibits no  distension. There is no tenderness.  Musculoskeletal: He exhibits no edema or tenderness.  Neurological: He is alert and oriented to person, place, and time.  Skin: Skin is warm and dry.  Psychiatric: He has a normal mood and affect. His behavior is normal. Thought content normal.  Nursing note and vitals reviewed.   BP 123/86 mmHg  Pulse 92  Resp 18  Ht 5\' 6"  (1.676 m)  Wt 156 lb (70.761 kg)  BMI 25.19 kg/m2  SpO2 97%       Assessment & Plan:  1: Chronic heart failure with reduced ejection fraction- Patient presents without any fatigue, shortness of breath or swelling in his legs or abdomen. He continues to weigh daily and brought his weight chart for review. Over the last month, his weight has been pretty stable. By our scale, his weight is unchanged. Reminded to call for an overnight weight gain of >2 pounds or a weekly weight gain of >5 pounds. He does say that if his weight goes up more than 2 pounds overnight, he will take an additional furosemide and he was counseled that he should take an additional potassium tablet with it. He is not adding any salt to his food but does eat often at fast food restaraunts. Is aware that those foods tend to be quite high in sodium. Would like to increase carvedilol but his heart rate was 59 at [facility] yesterday and he didn't take his medication yet today. Would also like to begin entresto but will defer to nephrology. [facility] and reviewed medications with the patient.  2: Tachycardia- Heart rate in the 90's but, again, he hasn't taken his medications yet today because he said he "wanted to see what his heart rate and blood pressure looked like when he didn't take his medications". Discussed the importance of taking everything as prescribed and also explained that I'd like to increase carvedilol but really can't without knowing what his heart rate consistently is when taking it. 59 yesterday but 92 today. He says that he'll take the  medication before coming next time. 3: Chronic kidney disease- [facility] is making a nephrology referral for him.   Reviewed medication bottles with the patient.   Return here in 3 months or sooner for any questions/problems before then.

## 2016-02-27 NOTE — Patient Instructions (Signed)
Continue weighing daily and call for an overnight weight gain of > 2 pounds or a weekly weight gain of >5 pounds. 

## 2016-03-18 ENCOUNTER — Ambulatory Visit: Payer: Self-pay

## 2016-03-25 ENCOUNTER — Ambulatory Visit: Payer: Self-pay | Admitting: Urology

## 2016-03-25 VITALS — BP 136/85 | HR 97 | Temp 98.0°F | Ht 66.0 in | Wt 158.0 lb

## 2016-03-25 DIAGNOSIS — R944 Abnormal results of kidney function studies: Secondary | ICD-10-CM

## 2016-03-25 DIAGNOSIS — E119 Type 2 diabetes mellitus without complications: Secondary | ICD-10-CM

## 2016-03-25 LAB — GLUCOSE, POCT (MANUAL RESULT ENTRY): POC Glucose: 91 mg/dl (ref 70–99)

## 2016-03-25 MED ORDER — CARVEDILOL 3.125 MG PO TABS: 3.1250 mg | ORAL_TABLET | Freq: Two times a day (BID) | ORAL | Status: DC

## 2016-03-25 MED ORDER — CARVEDILOL 3.125 MG PO TABS: 3.1250 mg | ORAL_TABLET | Freq: Two times a day (BID) | ORAL | Status: AC

## 2016-03-25 MED ORDER — HYDRALAZINE HCL 10 MG PO TABS: 10.0000 mg | ORAL_TABLET | Freq: Two times a day (BID) | ORAL | Status: DC

## 2016-03-25 NOTE — Progress Notes (Signed)
Patient: Cristian Goodman Male    DOB: 1956/06/20   60 y.o.   MRN: [id number] Visit Date: 03/25/2016  Today's Provider: [facility]   No chief complaint on file.  Subjective:    Diabetes He presents for his follow-up diabetic visit. He has type 2 diabetes mellitus. No MedicAlert identification noted. The initial diagnosis of diabetes was made 3 months ago. His disease course has been stable. There are no hypoglycemic associated symptoms. Associated symptoms include polydipsia and polyuria. Pertinent negatives for diabetes include no blurred vision, no chest pain, no fatigue, no foot paresthesias, no foot ulcerations, no polyphagia and no weight loss. There are no hypoglycemic complications. Diabetic complications include heart disease. Risk factors for coronary artery disease include hypertension, male sex and diabetes mellitus. When asked about current treatments, none were reported. He is following a high salt, high fat/cholesterol and generally unhealthy diet. When asked about meal planning, he reported none. He has not had a previous visit with a dietitian. He participates in exercise every other day. An ACE inhibitor/angiotensin II receptor blocker is not being taken. He does not see a podiatrist.Eye exam is not current.  Congestive Heart Failure Presents for follow-up visit. The disease course has been stable. The condition has lasted for 2 months. Pertinent negatives include no abdominal pain, chest pain, chest pressure, claudication, edema, fatigue, muscle weakness, near-syncope, nocturia, orthopnea, palpitations, shortness of breath or unexpected weight change. The symptoms have been improving. Past treatments include salt and fluid restriction and weight loss. The treatment provided significant relief. Compliance with prior treatments has been good. Prior compliance problems include insurance issues. His past medical history is significant for CAD, DM and HTN. There is no  history of anemia, arrhythmia, chronic lung disease, CVA, DVT, myocarditis, PE or valvular heart disease. He has one 1st degree relative with heart disease. Compliance with total regimen is 76-100%. Compliance problems include medication cost and adherence to diet.  Compliance with diet is 26-50%. Compliance with exercise is 76-100%. Compliance with medications is 51-75%.       No Known Allergies Previous Medications   ASPIRIN-ACETAMINOPHEN-CAFFEINE (EXCEDRIN PO)    Take by mouth as needed.   FUROSEMIDE (LASIX) 40 MG TABLET    Take 1 tablet (40 mg total) by mouth daily.   ISOSORBIDE MONONITRATE (IMDUR) 30 MG 24 HR TABLET    Take 15 mg by mouth daily.   POTASSIUM CHLORIDE (K-DUR) 10 MEQ TABLET    Take 1 tablet (10 mEq total) by mouth daily.    Review of Systems  Constitutional: Negative for weight loss, fatigue and unexpected weight change.  Eyes: Negative for blurred vision.  Respiratory: Negative for shortness of breath.   Cardiovascular: Negative for chest pain, palpitations, claudication and near-syncope.  Gastrointestinal: Negative for abdominal pain.  Endocrine: Positive for polydipsia and polyuria. Negative for polyphagia.  Genitourinary: Negative for nocturia.  Musculoskeletal: Negative for muscle weakness.    Social History  Substance Use Topics  . Smoking status: Never Smoker   . Smokeless tobacco: Never Used  . Alcohol Use: No   Objective:   BP 136/85 mmHg  Pulse 97  Temp(Src) 98 F (36.7 C) (Oral)  Ht  (1.676 m)  Wt 158 lb (71.668 kg)  BMI 25.51 kg/m2  SpO2 97%  Physical Exam Constitutional: Well nourished. Alert and oriented, No acute distress. HEENT: [place] AT, moist mucus membranes. Trachea midline, no masses. Cardiovascular: No clubbing, cyanosis, or edema. Respiratory: Normal respiratory effort, no  increased work of breathing. GI: Abdomen is soft, non tender, non distended, no abdominal masses. Liver and spleen not palpable.  No hernias appreciated.   Stool sample for occult testing is not indicated.   GU: No CVA tenderness.  No bladder fullness or masses.   Skin: No rashes, bruises or suspicious lesions. Lymph: No cervical or inguinal adenopathy. Neurologic: Grossly intact, no focal deficits, moving all 4 extremities. Psychiatric: Normal mood and affect.      Assessment & Plan:     1. Chronic heart failure with reduced ejection fraction (Class I)  -Encouraged him to go to ED/contact cardiology for an overnight weight gain of >2 pounds or a weekly weight gain of >5 pounds.  -Not following the 2000mg  sodium diet closely; eating fast food 4 to 5 times weekly   -TSH was 2.880 on 02/26/2016  -appointment with Dr. Odis LusterGollam (cardiology) on 04/16/2016   2. History of tachycardia  -wanting to increase carvediol dose but has episodes of low heart rate  -may start on entresto, waiting on nephrology referral  -appointment with Clarisa Kindredina Hackney, NP (05/28/2016) at the Heart Failure clinic  3. DM  -labs drawn tonight (CMP, Lipids, CBC, UA and microalbumin/urine)  -waiting on repeated kidney function tests before starting Metformin, May use Victoza  -return appointment on 04/22/2016  4. CKD, stage III  -nephrology consult is pending  -recheck CMP today      [facility]  [facility]

## 2016-03-26 LAB — URINALYSIS, COMPLETE
Bilirubin, UA: NEGATIVE
Glucose, UA: NEGATIVE
KETONES UA: NEGATIVE
Leukocytes, UA: NEGATIVE
Nitrite, UA: NEGATIVE
Protein, UA: NEGATIVE
RBC, UA: NEGATIVE
Specific Gravity, UA: 1.013 (ref 1.005–1.030)
Urobilinogen, Ur: 0.2 mg/dL (ref 0.2–1.0)
pH, UA: 6.5 (ref 5.0–7.5)

## 2016-03-26 LAB — COMPREHENSIVE METABOLIC PANEL
ALBUMIN: 4.5 g/dL (ref 3.6–4.8)
ALT: 29 IU/L (ref 0–44)
AST: 35 IU/L (ref 0–40)
Albumin/Globulin Ratio: 1.2 (ref 1.2–2.2)
Alkaline Phosphatase: 109 IU/L (ref 39–117)
BILIRUBIN TOTAL: 0.4 mg/dL (ref 0.0–1.2)
BUN / CREAT RATIO: 19 (ref 10–24)
BUN: 26 mg/dL (ref 8–27)
CHLORIDE: 101 mmol/L (ref 96–106)
CO2: 27 mmol/L (ref 18–29)
CREATININE: 1.35 mg/dL — AB (ref 0.76–1.27)
Calcium: 10.1 mg/dL (ref 8.6–10.2)
GFR calc non Af Amer: 57 mL/min/{1.73_m2} — ABNORMAL LOW (ref >59)
GFR, EST AFRICAN AMERICAN: 66 mL/min/{1.73_m2} (ref >59)
GLOBULIN, TOTAL: 3.7 g/dL (ref 1.5–4.5)
GLUCOSE: 97 mg/dL (ref 65–99)
Potassium: 4.9 mmol/L (ref 3.5–5.2)
Sodium: 142 mmol/L (ref 134–144)
Total Protein: 8.2 g/dL (ref 6.0–8.5)

## 2016-03-26 LAB — MICROSCOPIC EXAMINATION: Casts: NONE SEEN /lpf

## 2016-03-26 LAB — CBC WITH DIFFERENTIAL/PLATELET
Basophils Absolute: 0.1 10*3/uL (ref 0.0–0.2)
Basos: 1 %
EOS (ABSOLUTE): 0.4 10*3/uL (ref 0.0–0.4)
EOS: 6 %
HEMATOCRIT: 41.3 % (ref 37.5–51.0)
HEMOGLOBIN: 14.3 g/dL (ref 12.6–17.7)
Immature Grans (Abs): 0 10*3/uL (ref 0.0–0.1)
Immature Granulocytes: 0 %
LYMPHS ABS: 2.4 10*3/uL (ref 0.7–3.1)
Lymphs: 34 %
MCH: 32.6 pg (ref 26.6–33.0)
MCHC: 34.6 g/dL (ref 31.5–35.7)
MCV: 94 fL (ref 79–97)
MONOCYTES: 15 %
Monocytes Absolute: 1.1 10*3/uL — ABNORMAL HIGH (ref 0.1–0.9)
NEUTROS ABS: 3.2 10*3/uL (ref 1.4–7.0)
Neutrophils: 44 %
Platelets: 340 10*3/uL (ref 150–379)
RBC: 4.39 x10E6/uL (ref 4.14–5.80)
RDW: 14.4 % (ref 12.3–15.4)
WBC: 7 10*3/uL (ref 3.4–10.8)

## 2016-03-26 LAB — LIPID PANEL
Chol/HDL Ratio: 5.8 ratio units — ABNORMAL HIGH (ref 0.0–5.0)
Cholesterol, Total: 207 mg/dL — ABNORMAL HIGH (ref 100–199)
HDL: 36 mg/dL — AB (ref >39)
LDL Calculated: 116 mg/dL — ABNORMAL HIGH (ref 0–99)
TRIGLYCERIDES: 277 mg/dL — AB (ref 0–149)
VLDL Cholesterol Cal: 55 mg/dL — ABNORMAL HIGH (ref 5–40)

## 2016-03-26 LAB — MICROALBUMIN / CREATININE URINE RATIO
Creatinine, Urine: 77.7 mg/dL
MICROALB/CREAT RATIO: 25 mg/g{creat} (ref 0.0–30.0)
MICROALBUM., U, RANDOM: 19.4 ug/mL

## 2016-04-16 ENCOUNTER — Encounter: Payer: Self-pay | Admitting: Cardiovascular Disease

## 2016-04-16 ENCOUNTER — Ambulatory Visit (INDEPENDENT_AMBULATORY_CARE_PROVIDER_SITE_OTHER): Payer: Self-pay | Admitting: Cardiovascular Disease

## 2016-04-16 VITALS — BP 88/62 | HR 85 | Ht 66.0 in | Wt 155.5 lb

## 2016-04-16 DIAGNOSIS — I420 Dilated cardiomyopathy: Secondary | ICD-10-CM

## 2016-04-16 DIAGNOSIS — N183 Chronic kidney disease, stage 3 unspecified: Secondary | ICD-10-CM

## 2016-04-16 DIAGNOSIS — I5022 Chronic systolic (congestive) heart failure: Secondary | ICD-10-CM

## 2016-04-16 DIAGNOSIS — R000 Tachycardia, unspecified: Secondary | ICD-10-CM

## 2016-04-16 NOTE — Progress Notes (Signed)
Patient ID: Cristian Goodman, male   DOB: [birth year], 60 y.o.   MRN: [id number] Cardiology Office Note  Date:  04/16/2016   ID:  Cristian Goodman, DOB 12-Jul-1956, MRN [id number]  PCP:  Pcp Not In System   Chief Complaint  Patient presents with  . other    3 month f/u. Meds reviewed verbally.    HPI:  Cristian Goodman is a 60 year old gentleman with history of mental health issues, possible paranoia versus schizophrenia (no accurate diagnosis made as patient has refused psychiatric evaluation), who presented to [facility] with worsening shortness of breath, leg swelling, weight gain, ascites, echocardiogram confirming cardiomyopathy with ejection fraction of 30-35%, with aggressive diuresis through his hospital course on Lasix infusion with close to 12 L net output, dramatic improvement in his weight and symptoms who presents for follow-up after recent discharge from the hospital for acute systolic CHF he does not have primary care physician. Notes indicating chronic kidney disease stage III, diabetes type 2 with hemoglobin A1c 7.2.  In follow up today, He reports that he is doing well He is taking Lasix twice a day to maintain weight in the 150 range Does not feel dehydrated, Blood pressure low today but denies any symptoms of orthostasis Currently with no primary care  Denies any significant shortness of breath, sleeping well. No anorexia as he had previously, feels he is at his baseline. Breathing is back to normal Reports he is still paying off his hospital bill, does not want further testing  EKG on today's visit shows normal sinus rhythm with rate 85 bpm, old anterior MI   Has not seen nephrology Most recent creatinine down to 1.35  PMH:   has a past medical history of Obesity; Chronic systolic CHF (congestive heart failure) (HCC); Type II diabetes mellitus (HCC); and CKD (chronic kidney disease), stage III.  PSH:    Past Surgical History  Procedure Laterality Date  . Appendectomy     as a child ~ age 68  . 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 Aspirin-Acetaminophen-Caffeine (EXCEDRIN PO) Take by mouth as needed.    . carvedilol (COREG) 3.125 MG tablet Take 1 tablet (3.125 mg total) by mouth 2 (two) times daily with a meal. 180 tablet 0  . furosemide (LASIX) 40 MG tablet Take 1 tablet (40 mg total) by mouth daily. (Patient taking differently: Take 40 mg by mouth 2 (two) times daily. ) 30 tablet 6  . hydrALAZINE (APRESOLINE) 10 MG tablet Take 1 tablet (10 mg total) by mouth 2 (two) times daily. 180 tablet 0  . isosorbide mononitrate (IMDUR) 30 MG 24 hr tablet Take 15 mg by mouth daily.    . potassium chloride (K-DUR) 10 MEQ tablet Take 1 tablet (10 mEq total) by mouth daily. 30 tablet 6   No current facility-administered medications for this visit.     Allergies:   Review of patient's allergies indicates no known allergies.   Social History:  The patient  reports that he has never smoked. He has never used smokeless tobacco. He reports that he does not drink alcohol or use illicit drugs.   Family History:   family history includes Alzheimer's disease in his mother; CAD in his father; Diabetes in his father.    Review of Systems: Review of Systems  Constitutional: Negative.   Respiratory: Negative.   Cardiovascular: Negative.   Gastrointestinal: Negative.   Musculoskeletal:  Negative.   Neurological: Negative.   Psychiatric/Behavioral: Negative.   All other systems reviewed and are negative.    PHYSICAL EXAM: VS:  BP 88/62 mmHg  Pulse 85  Ht 5\' 6"  (1.676 m)  Wt 155 lb 8 oz (70.534 kg)  BMI 25.11 kg/m2 , BMI Body mass index is 25.11 kg/(m^2). GEN: Well nourished, well developed, in no acute distress HEENT: normal Neck: no JVD, carotid bruits, or masses Cardiac: RRR; no murmurs, rubs, or gallops,no edema  Respiratory:  clear to auscultation bilaterally, normal work of breathing GI:  soft, nontender, nondistended, + BS MS: no deformity or atrophy Skin: warm and dry, no rash Neuro:  Strength and sensation are intact Psych: euthymic mood, full affect    Recent Labs: 01/07/2016: B Natriuretic Peptide 1655.0* 01/08/2016: Hemoglobin 14.3 02/26/2016: TSH 2.880 03/25/2016: ALT 29; BUN 26; Creatinine, Ser 1.35*; Platelets 340; Potassium 4.9; Sodium 142    Lipid Panel Lab Results  Component Value Date   CHOL 207* 03/25/2016   HDL 36* 03/25/2016   LDLCALC 116* 03/25/2016   TRIG 277* 03/25/2016      Wt Readings from Last 3 Encounters:  04/16/16 155 lb 8 oz (70.534 kg)  03/25/16 158 lb (71.668 kg)  02/27/16 156 lb (70.761 kg)       ASSESSMENT AND PLAN:  Congestive dilated cardiomyopathy (HCC) - Plan: EKG 12-Lead Reports he is feeling back to his baseline Ideally we would repeat echocardiogram that he has not paid off his hospital bill from several months ago and is requesting no further testing. Suspect his ejection fraction may have improved.  Tachycardia - Plan: EKG 12-Lead Heart rate well controlled on his current medication regimen. No changes made  CKD (chronic kidney disease), stage III Creatinine 1.35, likely from improved cardiac output After discussion of improved numbers, he has indicated that he will cancel his nephrology appointment  Chronic systolic heart failure (HCC) Appears relatively euvolemic on today's visit. As he has no insurance, will not add entresto. Blood pressure is too low to add any further medications  Disposition:   F/U  6 months   Orders Placed This Encounter  Procedures  . EKG 12-Lead    Total encounter time more than 15 minutes  Greater than 50% was spent in counseling and coordination of care with the patient   Signed, Dossie Arbourim Gollan, M.D., Ph.D. 04/16/2016  [facility] [place] [phone]

## 2016-04-16 NOTE — Patient Instructions (Signed)
You are doing well. No medication changes were made.  Please call us if you have new issues that need to be addressed before your next appt.  Your physician wants you to follow-up in: 6 months.  You will receive a reminder letter in the mail two months in advance. If you don't receive a letter, please call our office to schedule the follow-up appointment.   

## 2016-04-22 ENCOUNTER — Ambulatory Visit: Payer: Self-pay | Admitting: Endocrinology

## 2016-04-22 VITALS — BP 100/54 | HR 72 | Wt 158.0 lb

## 2016-04-22 DIAGNOSIS — N183 Chronic kidney disease, stage 3 unspecified: Secondary | ICD-10-CM

## 2016-04-22 DIAGNOSIS — E1122 Type 2 diabetes mellitus with diabetic chronic kidney disease: Secondary | ICD-10-CM

## 2016-04-22 NOTE — Progress Notes (Signed)
Patient ID: Cristian Goodman, male   DOB: 1956/02/07, 60 y.o.   MRN: [id number]   Assessment:   Mr. Cristian Goodman is a 60 year old white male with a history of heat disease diagnosed on 01/07/2016 on hospital admission. During this visit he was diagnosed with T2DM because of a 7.2 A1c. Given that he has well maintained care of his blood glucose levels currently and has several other comorbidities, the endocrinology care team does not feel that he needs to be monitored continuously for his diabetes. Would like an A1c follow up on his next clinical[facility]      Plan:    1.  Rx changes: None  2.  Education: Reviewed diabetes management:  Appropriate A1C target, avoid hypoglycemia,  blood pressure (<140/80), and cholesterol (LDL <100). -   Reviewed importance of good glycemic control to reduce risk for complications.   3.   Get regular physical activity at least 30 minutes a day of moderate intensity most days of the week  This discussion took at least 15 minutes out of a total visit time of 25 minutes today.  4. Follow up: Patient does not need to follow-up in diabetes clinic.  5. Referrals: Follow up with PCP about managing diabetes.   Subjective:    Cristian Goodman is a 60 y.o. male who is seen in follow up forType 2 diabetes from 03/18/2016  complicated by heart failure.    Cardiovascular risk factors: history of hear disease.  Eye exam current (within one year):     Cristian Flavinimothy D Kiene is performing SMBG on average 1-2 times per day.   Average over last 80 days is 130. Standard deviation is .Marland Kitchen.   Denies chest pain, shortness of breath, edema, foot lesions or ulcers.  Denies severe hypoglycemia or admission to hospital for DKA.  Current exercise: Initiated weight lifting approximately a month ago.   The patient's history was reviewed and updated as appropriate.  No Known Allergies   Current outpatient prescriptions:  .   Aspirin-Acetaminophen-Caffeine (EXCEDRIN PO), Take by mouth as needed., Disp: , Rfl:  .  carvedilol (COREG) 3.125 MG tablet, Take 1 tablet (3.125 mg total) by mouth 2 (two) times daily with a meal., Disp: 180 tablet, Rfl: 0 .  furosemide (LASIX) 40 MG tablet, Take 1 tablet (40 mg total) by mouth daily. (Patient taking differently: Take 40 mg by mouth 2 (two) times daily. ), Disp: 30 tablet, Rfl: 6 .  hydrALAZINE (APRESOLINE) 10 MG tablet, Take 1 tablet (10 mg total) by mouth 2 (two) times daily., Disp: 180 tablet, Rfl: 0 .  isosorbide mononitrate (IMDUR) 30 MG 24 hr tablet, Take 15 mg by mouth daily., Disp: , Rfl:  .  potassium chloride (K-DUR) 10 MEQ tablet, Take 1 tablet (10 mEq total) by mouth daily., Disp: 30 tablet, Rfl: 6  Social History   Social History  . Marital Status: Single    Spouse Name: N/A  . Number of Children: N/A  . Years of Education: N/A   Social History Main Topics  . Smoking status: Never Smoker   . Smokeless tobacco: Never Used  . Alcohol Use: No  . Drug Use: No  . Sexual Activity: Not on file   Other Topics Concern  . Not on file   Social History Narrative   [place].  Does not routinely exercise.  Unemployed.  Takes care of his 60 y/o father (who lives by himself).  Family History  Problem Relation Age of Onset  . Alzheimer's disease Mother     died @ 42.  . Diabetes Father     alive @ 57  . CAD Father     Review of Systems A 12 point review of systems was negative except for pertinent items noted in the HPI.   Objective:     Wt Readings from Last 3 Encounters:  04/16/16 155 lb 8 oz (70.534 kg)  03/25/16 158 lb (71.668 kg)  02/27/16 156 lb (70.761 kg)   There were no vitals taken for this visit.  General appearance:  alert and appears stated age, in no distress      Eyes:  conjunctivae/corneas clear. EOM's intact. Sclera anicteric. Negative lid lag or proptosis     Neck: no adenopathy, supple, symmetrical,  trachea midline  Thyroid:  Mobile, normal size, no palpable nodule  Lung: clear to auscultation bilaterally  Heart:  regular rate and rhythm, S1, S2 normal, no murmur, click, rub or gallop  Abdomen:  bowel sounds normal; negative bruits  Extremities: extremities normal, atraumatic, no cyanosis or edema     Pulses: DP & PT 2+ and symmetric.  Neuro: normal without focal findings, mental status, speech normal, alert and oriented x3, reflexes normal and symmetric and gait normal.   Feet: negative lesions or ulcers, 10 gram monofilament intact bilateral plantar surface     Lab Review No components found for: A1C GLUCOSE (mg/dL)  Date Value  16/08/9603 97  02/26/2016 126*   GLUCOSE, BLD (mg/dL)  Date Value  54/07/8118 100*  01/10/2016 131*  01/09/2016 101*   CO2 (mmol/L)  Date Value  03/25/2016 27  02/26/2016 26  01/11/2016 35*   BUN (mg/dL)  Date Value  14/78/2956 26  02/26/2016 26  01/11/2016 35*  01/10/2016 32*  01/09/2016 31*   CREATININE, SER (mg/dL)  Date Value  21/30/8657 1.35*  02/26/2016 1.69*  01/11/2016 2.16*   No components found for: LDL,  LDLCALC,  LDLDIRECT Lab Results  Component Value Date   NA 142 03/25/2016   K 4.9 03/25/2016   CL 101 03/25/2016   CO2 27 03/25/2016   BUN 26 03/25/2016   CREATININE 1.35* 03/25/2016   GFRAA 66 03/25/2016   GFRNONAA 57* 03/25/2016   CALCIUM 10.1 03/25/2016   ALBUMIN 4.5 03/25/2016     DIABETES MELLITUS RESULTS: Lab Results  Component Value Date   HGBA1C 7.2* 01/07/2016   Lab Results  Component Value Date   LDLCALC 116* 03/25/2016   CREATININE 1.35* 03/25/2016   Lab Results  Component Value Date   CHOL 207* 03/25/2016   Lab Results  Component Value Date   LDLCALC 116* 03/25/2016   No components found for: CHOLLLDLDIRECT No components found for: MICROALB/CR Lab Results  Component Value Date   GFRAA 66 03/25/2016   GFRNONAA 57* 03/25/2016

## 2016-05-07 ENCOUNTER — Other Ambulatory Visit: Payer: Self-pay | Admitting: Family

## 2016-05-08 ENCOUNTER — Other Ambulatory Visit: Payer: Self-pay | Admitting: Family

## 2016-05-12 ENCOUNTER — Other Ambulatory Visit: Payer: Self-pay | Admitting: Cardiovascular Disease

## 2016-05-27 ENCOUNTER — Ambulatory Visit: Payer: Self-pay

## 2016-05-28 ENCOUNTER — Ambulatory Visit: Payer: Self-pay | Attending: Family | Admitting: Family

## 2016-05-28 ENCOUNTER — Encounter: Payer: Self-pay | Admitting: Family

## 2016-05-28 VITALS — BP 155/103 | HR 104 | Resp 18 | Ht 66.0 in | Wt 158.0 lb

## 2016-05-28 DIAGNOSIS — Z6825 Body mass index (BMI) 25.0-25.9, adult: Secondary | ICD-10-CM | POA: Insufficient documentation

## 2016-05-28 DIAGNOSIS — Z833 Family history of diabetes mellitus: Secondary | ICD-10-CM | POA: Insufficient documentation

## 2016-05-28 DIAGNOSIS — F341 Dysthymic disorder: Secondary | ICD-10-CM | POA: Insufficient documentation

## 2016-05-28 DIAGNOSIS — E1122 Type 2 diabetes mellitus with diabetic chronic kidney disease: Secondary | ICD-10-CM | POA: Insufficient documentation

## 2016-05-28 DIAGNOSIS — I10 Essential (primary) hypertension: Secondary | ICD-10-CM | POA: Insufficient documentation

## 2016-05-28 DIAGNOSIS — Z9081 Acquired absence of spleen: Secondary | ICD-10-CM | POA: Insufficient documentation

## 2016-05-28 DIAGNOSIS — R000 Tachycardia, unspecified: Secondary | ICD-10-CM | POA: Insufficient documentation

## 2016-05-28 DIAGNOSIS — Z7982 Long term (current) use of aspirin: Secondary | ICD-10-CM | POA: Insufficient documentation

## 2016-05-28 DIAGNOSIS — I130 Hypertensive heart and chronic kidney disease with heart failure and stage 1 through stage 4 chronic kidney disease, or unspecified chronic kidney disease: Secondary | ICD-10-CM | POA: Insufficient documentation

## 2016-05-28 DIAGNOSIS — Z8249 Family history of ischemic heart disease and other diseases of the circulatory system: Secondary | ICD-10-CM | POA: Insufficient documentation

## 2016-05-28 DIAGNOSIS — Z9089 Acquired absence of other organs: Secondary | ICD-10-CM | POA: Insufficient documentation

## 2016-05-28 DIAGNOSIS — I5022 Chronic systolic (congestive) heart failure: Secondary | ICD-10-CM | POA: Insufficient documentation

## 2016-05-28 DIAGNOSIS — E669 Obesity, unspecified: Secondary | ICD-10-CM | POA: Insufficient documentation

## 2016-05-28 DIAGNOSIS — N183 Chronic kidney disease, stage 3 (moderate): Secondary | ICD-10-CM | POA: Insufficient documentation

## 2016-05-28 DIAGNOSIS — Z820 Family history of epilepsy and other diseases of the nervous system: Secondary | ICD-10-CM | POA: Insufficient documentation

## 2016-05-28 MED ORDER — HYDRALAZINE HCL 10 MG PO TABS: 10.0000 mg | ORAL_TABLET | Freq: Two times a day (BID) | ORAL | 3 refills | Status: AC

## 2016-05-28 MED ORDER — POTASSIUM CHLORIDE ER 10 MEQ PO TBCR: 10.0000 meq | EXTENDED_RELEASE_TABLET | Freq: Every day | ORAL | 3 refills | Status: AC

## 2016-05-28 NOTE — Patient Instructions (Signed)
Continue weighing daily and call for an overnight weight gain of > 2 pounds or a weekly weight gain of >5 pounds. 

## 2016-05-28 NOTE — Progress Notes (Signed)
Subjective:    Patient ID: Cristian Goodman, male    DOB: 14-Jul-1956, 60 y.o.   MRN: [id number]  Congestive Heart Failure  Presents for follow-up visit. Pertinent negatives include no abdominal pain, chest pain, edema, fatigue, orthopnea, palpitations or shortness of breath. The symptoms have been stable. Compliance with total regimen is 76-100%.  Hypertension  This is a chronic problem. The current episode started more than 1 year ago. The problem has been waxing and waning since onset. The problem is uncontrolled. Pertinent negatives include no chest pain, neck pain, palpitations, peripheral edema or shortness of breath. There are no associated agents to hypertension. Risk factors for coronary artery disease include male gender, family history and diabetes mellitus. The current treatment provides mild improvement. Compliance problems include psychosocial issues.  Hypertensive end-organ damage includes kidney disease and heart failure.   Past Medical History:  Diagnosis Date  . Chronic systolic CHF (congestive heart failure) (HCC)    a. 01/2016 Echo: EF 30-35%, triv AI, mildly dil RV/RA, mod TR, small peric eff.  . CKD (chronic kidney disease), stage III    a. First discovered 01/2016.  . Obesity   . Type II diabetes mellitus (HCC)    a. Dx 01/2016    Past Surgical History:  Procedure Laterality Date  . APPENDECTOMY     as a child ~ age 31  . 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 Alzheimer's disease Mother     died @ 57.  . Diabetes Father     alive @ 69  . CAD Father     Social History  Substance Use Topics  . Smoking status: Never Smoker  . Smokeless tobacco: Never Used  . Alcohol use No    No Known Allergies  Prior to Admission medications   Medication Sig Start Date End Date Taking? Authorizing Provider  Aspirin-Acetaminophen-Caffeine (EXCEDRIN PO) Take by mouth as needed.    Historical Provider, MD  carvedilol  (COREG) 3.125 MG tablet Take 1 tablet (3.125 mg total) by mouth 2 (two) times daily with a meal. 03/25/16   Carollee Herter A McGowan, PA-C  furosemide (LASIX) 40 MG tablet Take 1 tablet (40 mg total) by mouth daily. 01/14/16   Antonieta Iba, MD  hydrALAZINE (APRESOLINE) 10 MG tablet Take 1 tablet (10 mg total) by mouth 2 (two) times daily. 05/28/16   Delma Freeze, FNP  isosorbide mononitrate (IMDUR) 30 MG 24 hr tablet TAKE 1/2 TABLET ( ) BY MOUTH ONCE DAILY. 05/12/16   Antonieta Iba, MD  potassium chloride (K-DUR) 10 MEQ tablet Take 1 tablet (10 mEq total) by mouth daily. 05/28/16   Delma Freeze, FNP      Review of Systems  Constitutional: Negative for appetite change and fatigue.  HENT: Negative for congestion, rhinorrhea and sore throat.   Eyes: Negative.   Respiratory: Negative for cough, chest tightness and shortness of breath.   Cardiovascular: Negative for chest pain, palpitations and leg swelling.  Gastrointestinal: Negative for abdominal distention and abdominal pain.  Endocrine: Negative.   Genitourinary: Negative.   Musculoskeletal: Negative for back pain and neck pain.  Skin: Negative.   Allergic/Immunologic: Negative.   Neurological: Negative for dizziness and light-headedness.  Hematological: Negative for adenopathy. Does not bruise/bleed easily.  Psychiatric/Behavioral: Positive for dysphoric mood (mild). Negative for sleep disturbance (sleeping on 0 pillows). The patient is not nervous/anxious.  Objective:   Physical Exam  Constitutional: He is oriented to person, place, and time. He appears well-developed and well-nourished.  HENT:  Head: Normocephalic and atraumatic.  Eyes: Conjunctivae are normal. Pupils are equal, round, and reactive to light.  Neck: Neck supple.  Cardiovascular: Regular rhythm.  Tachycardia present.   Pulmonary/Chest: Effort normal. He has no wheezes. He has no rales.  Abdominal: Soft. He exhibits no distension. There is no tenderness.   Musculoskeletal: He exhibits no edema or tenderness.  Neurological: He is alert and oriented to person, place, and time.  Skin: Skin is warm and dry.  Psychiatric: He has a normal mood and affect. His behavior is normal. Thought content normal.  Nursing note and vitals reviewed.   BP (!) 155/103   Pulse (!) 104   Resp 18   Ht 5\' 6"  (1.676 m)   Wt 158 lb (71.7 kg)   SpO2 100%   BMI 25.50 kg/m        Assessment & Plan:  1: Chronic heart failure with reduced ejection fraction- Patient presents currently without any fatigue, shortness of breath or swelling in his legs or abdomen. He says that he still weighs himself daily and says that his weight has been stable. By our scale he's gained 2.4 pounds since he was last here on 02/27/16. Reminded to call for an overnight weight gain of >2 pounds or a weekly weight gain of >5 pounds. He is not adding additional salt to his food but admits to eating high sodium foods recently such as pork chops, bacon and ham. Discussed the importance of closely following a 2000mg  sodium diet. He is not interested in changing any of his medications at this time.  2: HTN- Blood pressure elevated but, again, patient did not take his medications yet today. He says that it hasn't been high when checked elsewhere and discussed the importance of taking his medications prior to coming to his appointments so that his blood pressure can accurately be assessed. 3: Tachycardia- Patient has not taken his carvedilol yet today. Again, is not interested in changing medication and mentions that he doesn't think that there's anything wrong with him and that he doesn't feel like he needs all his current medications.   Medication bottles were reviewed with him.   Return here in 6 months or sooner for any questions/problems before then.

## 2016-11-28 ENCOUNTER — Telehealth: Payer: Self-pay

## 2016-11-28 NOTE — Telephone Encounter (Signed)
Mr. Cristian Goodman called and stated he would like to cancel his appointment on 12/01/2016. He states that the is doing well and does not wish to come in or reschedule his appointment. I cancelled the appointment and advised him to contact the office should symptoms arise.

## 2016-12-01 ENCOUNTER — Ambulatory Visit: Payer: Self-pay | Admitting: Family

## 2017-02-05 ENCOUNTER — Encounter: Payer: Self-pay | Admitting: Pharmacist

## 2017-10-17 IMAGING — CR DG CHEST 2V
1 series · 2 of 2 positions shown · non-contrast
Comparison: 01/07/2016 radiographs.

CLINICAL DATA: 60-year-old with bilateral lower extremity swelling
for 1 month. Shortness of breath. Acute congestive heart failure.

EXAM:
CHEST  2 VIEW

[Series 1: dg chest 2 view · 0.14mm/px · 2 of 2 slices shown]
[im 1/2]
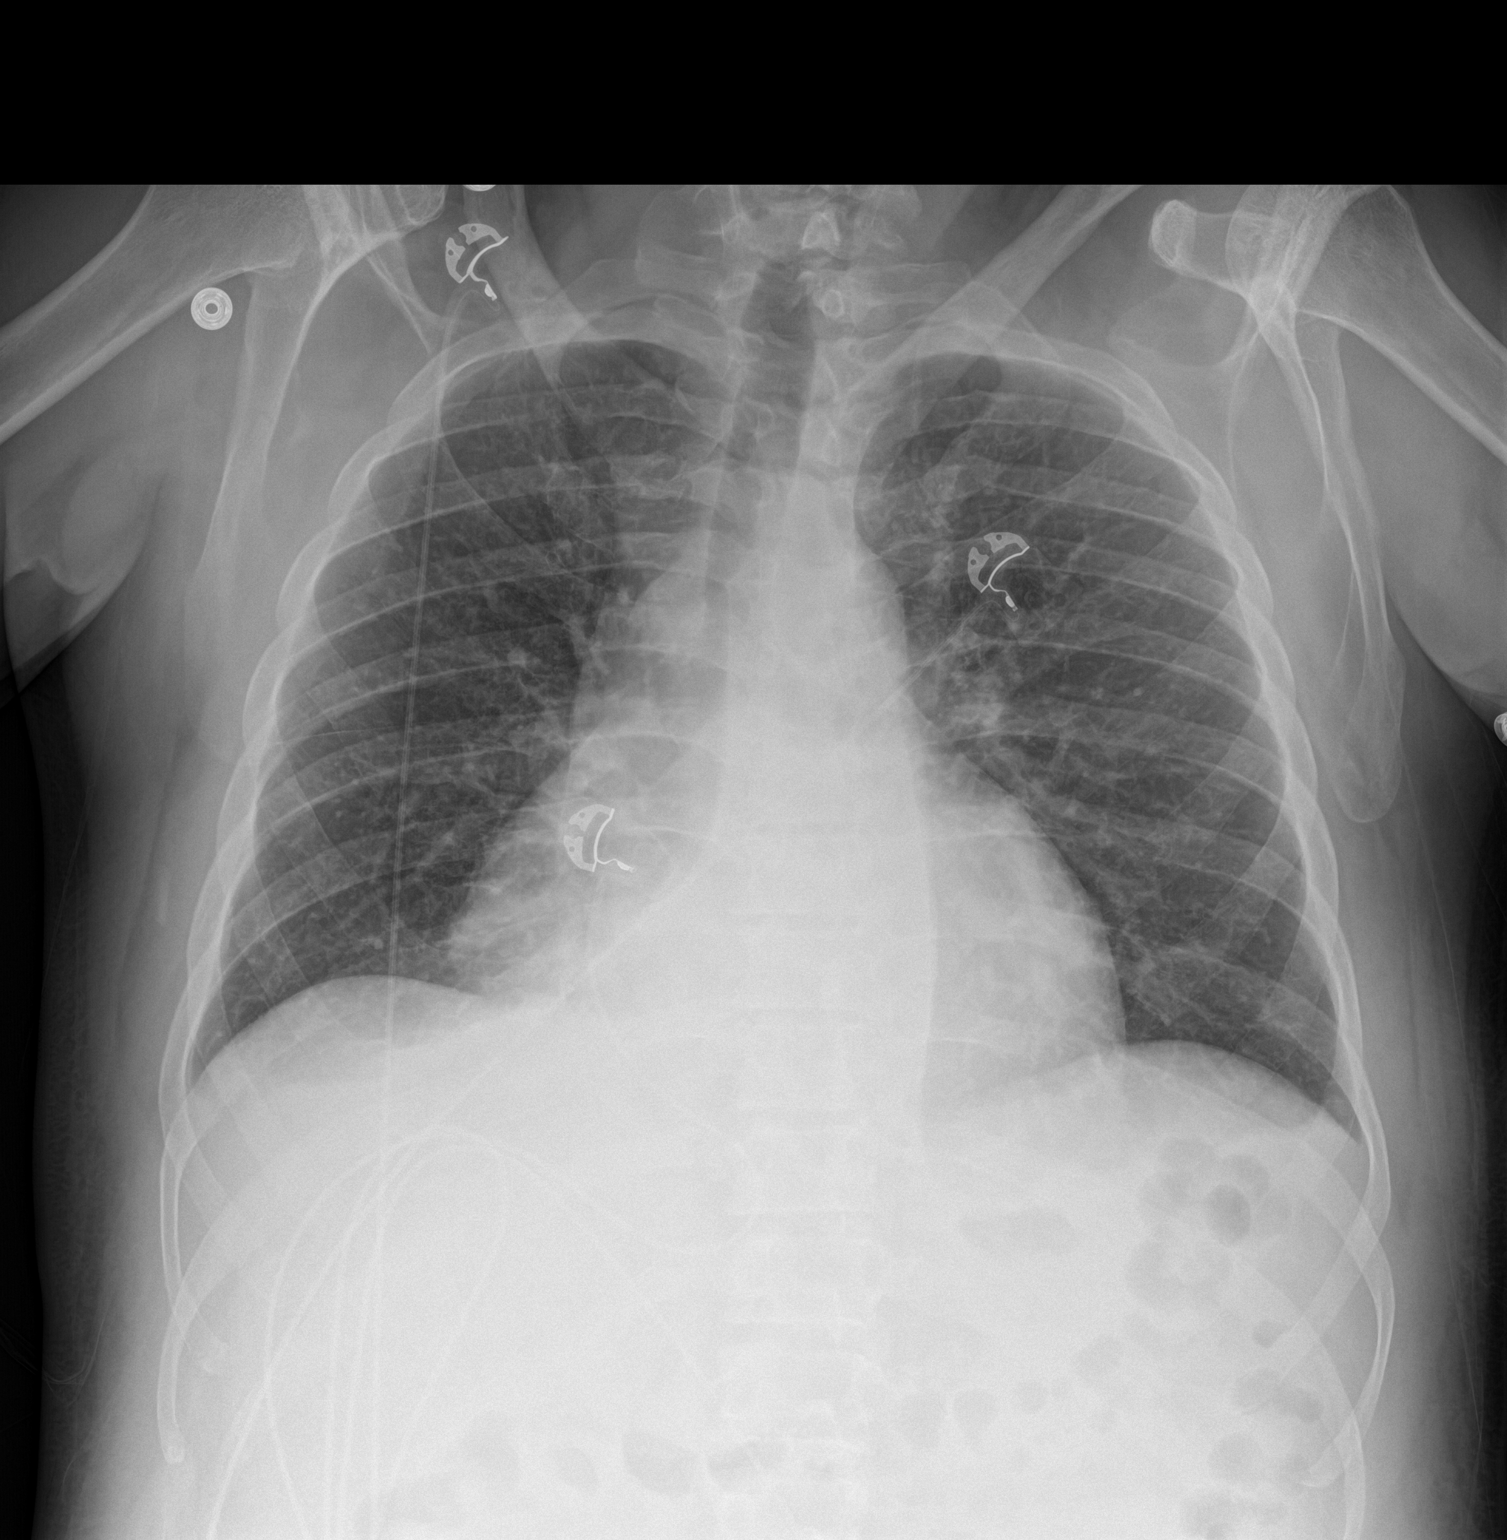
[im 2/2]
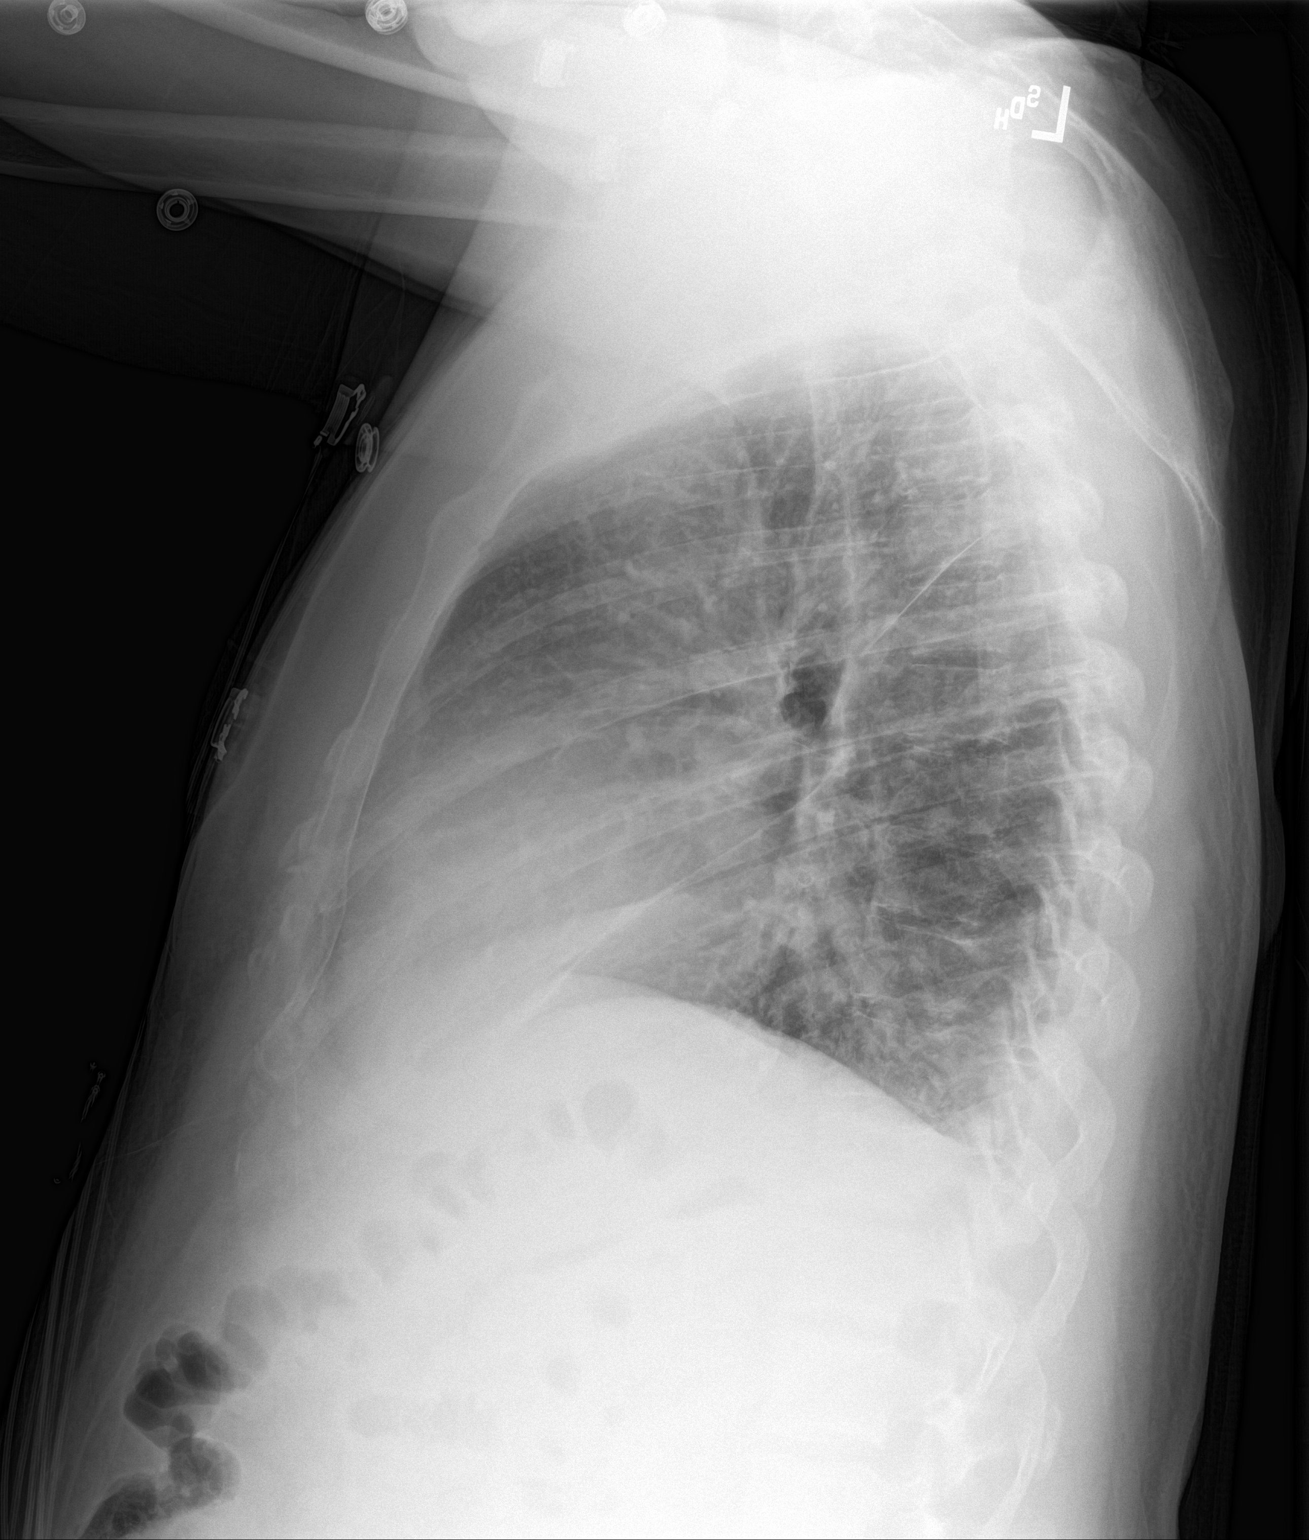

[2 of 2 positions shown; findings below may reference images not displayed]

FINDINGS: Cardiomegaly, pulmonary edema and bilateral pleural effusions have
mildly improved since yesterday's examination. There is associated
mild bibasilar atelectasis, although no confluent airspace opacity.
The bones appear unchanged.
IMPRESSION: Interval improvement in mild congestive heart failure.

## 2020-10-03 DEATH — deceased

## 2023-06-16 ENCOUNTER — Other Ambulatory Visit: Payer: Self-pay
